# Patient Record
Sex: Male | Born: 1945 | Race: Black or African American | Hispanic: No | Marital: Married | Smoking: Former smoker
Health system: Southern US, Community
[De-identification: ages and names within clinical notes are randomized; demographics above are authoritative.]

## PROBLEM LIST (undated history)

## (undated) DIAGNOSIS — M109 Gout, unspecified: Secondary | ICD-10-CM

## (undated) DIAGNOSIS — I10 Essential (primary) hypertension: Secondary | ICD-10-CM

## (undated) DIAGNOSIS — K635 Polyp of colon: Secondary | ICD-10-CM

## (undated) DIAGNOSIS — G629 Polyneuropathy, unspecified: Secondary | ICD-10-CM

## (undated) DIAGNOSIS — B1920 Unspecified viral hepatitis C without hepatic coma: Secondary | ICD-10-CM

## (undated) DIAGNOSIS — D696 Thrombocytopenia, unspecified: Secondary | ICD-10-CM

## (undated) DIAGNOSIS — Z9289 Personal history of other medical treatment: Secondary | ICD-10-CM

## (undated) DIAGNOSIS — I4892 Unspecified atrial flutter: Secondary | ICD-10-CM

## (undated) DIAGNOSIS — D509 Iron deficiency anemia, unspecified: Secondary | ICD-10-CM

## (undated) HISTORY — PX: CERVICAL DISC SURGERY: SHX588

## (undated) HISTORY — DX: Unspecified viral hepatitis C without hepatic coma: B19.20

## (undated) HISTORY — PX: REVISION TOTAL KNEE ARTHROPLASTY: SHX767

## (undated) HISTORY — PX: BACK SURGERY: SHX140

## (undated) HISTORY — DX: Polyp of colon: K63.5

## (undated) HISTORY — DX: Iron deficiency anemia, unspecified: D50.9

## (undated) HISTORY — DX: Thrombocytopenia, unspecified: D69.6

## (undated) HISTORY — DX: Personal history of other medical treatment: Z92.89

---

## 2010-10-04 ENCOUNTER — Observation Stay (HOSPITAL_COMMUNITY)
Admission: EM | Admit: 2010-10-04 | Discharge: 2010-10-06 | Payer: Self-pay | Source: Home / Self Care | Admitting: Emergency Medicine

## 2010-10-08 ENCOUNTER — Encounter: Payer: Self-pay | Admitting: Cardiology

## 2010-10-12 DIAGNOSIS — M109 Gout, unspecified: Secondary | ICD-10-CM | POA: Insufficient documentation

## 2010-10-12 DIAGNOSIS — I10 Essential (primary) hypertension: Secondary | ICD-10-CM | POA: Insufficient documentation

## 2010-10-15 ENCOUNTER — Ambulatory Visit: Payer: Self-pay | Admitting: Cardiology

## 2010-11-08 ENCOUNTER — Inpatient Hospital Stay (HOSPITAL_COMMUNITY)
Admission: RE | Admit: 2010-11-08 | Discharge: 2010-11-10 | Payer: Self-pay | Source: Home / Self Care | Attending: Orthopedic Surgery | Admitting: Orthopedic Surgery

## 2010-11-10 ENCOUNTER — Ambulatory Visit: Payer: Self-pay | Admitting: Oncology

## 2010-11-25 ENCOUNTER — Ambulatory Visit: Payer: Self-pay | Admitting: Oncology

## 2010-11-25 LAB — CBC & DIFF AND RETIC
BASO%: 0.4 % (ref 0.0–2.0)
Basophils Absolute: 0 10*3/uL (ref 0.0–0.1)
EOS%: 1.4 % (ref 0.0–7.0)
Eosinophils Absolute: 0.1 10*3/uL (ref 0.0–0.5)
HCT: 30.1 % — ABNORMAL LOW (ref 38.4–49.9)
HGB: 9.9 g/dL — ABNORMAL LOW (ref 13.0–17.1)
Immature Retic Fract: 3.3 % (ref 0.00–13.40)
LYMPH%: 29.4 % (ref 14.0–49.0)
MCH: 27 pg — ABNORMAL LOW (ref 27.2–33.4)
MCHC: 32.9 g/dL (ref 32.0–36.0)
MCV: 82.2 fL (ref 79.3–98.0)
MONO#: 0.8 10*3/uL (ref 0.1–0.9)
MONO%: 10.4 % (ref 0.0–14.0)
NEUT#: 4.3 10*3/uL (ref 1.5–6.5)
NEUT%: 58.4 % (ref 39.0–75.0)
Platelets: 272 10*3/uL (ref 140–400)
RBC: 3.66 10*6/uL — ABNORMAL LOW (ref 4.20–5.82)
RDW: 16.8 % — ABNORMAL HIGH (ref 11.0–14.6)
Retic %: 2.2 % — ABNORMAL HIGH (ref 0.50–1.60)
Retic Ct Abs: 80.52 10*3/uL — ABNORMAL HIGH (ref 24.10–77.50)
WBC: 7.3 10*3/uL (ref 4.0–10.3)
lymph#: 2.2 10*3/uL (ref 0.9–3.3)

## 2010-11-25 LAB — MORPHOLOGY: PLT EST: ADEQUATE

## 2010-11-25 LAB — COMPREHENSIVE METABOLIC PANEL
ALT: 15 U/L (ref 0–53)
AST: 29 U/L (ref 0–37)
Albumin: 3.6 g/dL (ref 3.5–5.2)
Alkaline Phosphatase: 50 U/L (ref 39–117)
BUN: 14 mg/dL (ref 6–23)
CO2: 27 mEq/L (ref 19–32)
Calcium: 9.9 mg/dL (ref 8.4–10.5)
Chloride: 105 mEq/L (ref 96–112)
Creatinine, Ser: 1.19 mg/dL (ref 0.40–1.50)
Glucose, Bld: 101 mg/dL — ABNORMAL HIGH (ref 70–99)
Potassium: 4.1 mEq/L (ref 3.5–5.3)
Sodium: 140 mEq/L (ref 135–145)
Total Bilirubin: 0.8 mg/dL (ref 0.3–1.2)
Total Protein: 8 g/dL (ref 6.0–8.3)

## 2010-11-25 LAB — LACTATE DEHYDROGENASE: LDH: 261 U/L — ABNORMAL HIGH (ref 94–250)

## 2010-11-25 LAB — CHCC SMEAR

## 2010-11-26 LAB — IRON AND TIBC
%SAT: 19 % — ABNORMAL LOW (ref 20–55)
Iron: 70 ug/dL (ref 42–165)
TIBC: 370 ug/dL (ref 215–435)
UIBC: 300 ug/dL

## 2010-12-21 NOTE — Assessment & Plan Note (Signed)
Summary: np6/ surgical clearance- right total knee - pt has medicare.gd   Visit Type:  Initial Consult Referring Provider:  Dr. Lajoyce Corners Primary Provider:  [facility]  CC:  Preoperative and hypertension.  History of Present Illness: The patient presents for preoperative evaluation prior to having knee replacement.  He has had no prior cardiac history. He has had hypertension which was previously well controlled. However, he was recently admitted with a gout flare and apparently was dehydrated. He was taken off atenolol and his HCTZ was reduced. He subsequently has had difficult to control blood pressures. His wife restarted the previous dose of atenolol HCTZ 2 days ago. For the last couple of days his blood pressure has been running still in the 180-170 systolic range. He is limited in his activities because of knee pain and is due to have right knee replacement. He walks with a walker or rides in a wheelchair because of this. Prior to the pain becoming so severe he had some limitations walking with a limp since previous knee surgeries on the left. However, he has been active. He does not have any chest pressure, neck or arm discomfort. He does not have any palpitations, presyncope or syncope. He has no shortness of breath, PND or orthopnea. He has no weight gain but does have chronic right greater than left lower extremity edema.  Current Medications (verified): 1)  Trazodone Hcl 100 Mg Tabs (Trazodone Hcl) .... One Tab By Mouth Hs As Needed 2)  Sb Docusate Sodium/senna 8.6-50 Mg Tabs (Sennosides-Docusate Sodium) .... 2 Tab By Mouth Hs As Needed 3)  Citalopram Hydrobromide 40 Mg Tabs (Citalopram Hydrobromide) .Marland Kitchen.. 1 and 1/2 Tab By Mouth Once Daily 4)  Atenolol-Chlorthalidone 100-25 Mg Tabs (Atenolol-Chlorthalidone) .... One Tab By Mouth Once Daily 5)  Indomethacin 50 Mg Caps (Indomethacin) .... One Tab By Mouth Q 6 Hours As Needed 6)  Pantoprazole Sodium 40 Mg Tbec (Pantoprazole Sodium) .... One  Tab By Mouth Once Daily  Allergies (verified): 1)  ! * Lenzolid  Past History:  Past Medical History:  1. Hypertension.   2. Gout.   3. Reflex sympathetic dystrophy  4. PTSD  Past Surgical History: Several knee  surgeries on the left to include total knee arthroplasty that failed and   was revised through the [place] with a total of approximately 6 surgeries on  that knee.  Cervical disc surgery Low back surgery  Family History: Reviewed history from 10/12/2010 and no changes required. His father had emphysema.  His mother had a stroke.  Social History: Reviewed history from 10/12/2010 and no changes required.  The patient has a history of being in [employer].  He   is a retired [employer] architect.  He is married.  He has 3 children.  He has   got a past history of smoking tobacco use, but nothing now and no   history of alcohol or drug abuse.      Review of Systems       Positive for insomnia, back pain. Otherwise as stated in the history of present illness negative for all other systems.  Vital Signs:  Patient profile:   65 year old male Height:      73 inches Weight:      202 pounds BMI:     26.75 Pulse rate:   67 / minute Pulse rhythm:   regular BP sitting:   168 / 70  (right arm) Cuff size:   regular  Vitals Entered By: Deliah Goody,  RN (October 15, 2010 12:55 PM)  Physical Exam  General:  Well developed, well nourished, in no acute distress. Head:  normocephalic and atraumatic Eyes:  PERRLA/EOM intact; conjunctiva and lids normal. Mouth:  Teeth, gums and palate normal. Oral mucosa normal. Neck:  Neck supple, no JVD. No masses, thyromegaly or abnormal cervical nodes. Chest Wall:  no deformities or breast masses noted Lungs:  Clear bilaterally to auscultation and percussion. Abdomen:  Bowel sounds positive; abdomen soft and non-tender without masses, organomegaly, or hernias noted. No hepatosplenomegaly. Msk:  Lower extremity muscle wasting, bilateral chronic  degenerative knee changes Extremities:  Mild right greater than left lower extremity edema Neurologic:  Alert and oriented x 3. Skin:  Intact without lesions or rashes. Cervical Nodes:  no significant adenopathy Inguinal Nodes:  no significant adenopathy Psych:  Normal affect.   Detailed Cardiovascular Exam  Neck    Carotids: Carotids full and equal bilaterally without bruits.      Neck Veins: Normal, no JVD.    Heart    Inspection: no deformities or lifts noted.      Palpation: normal PMI with no thrills palpable.      Auscultation: regular rate and rhythm, S1, S2 without murmurs, rubs, gallops, or clicks.    Vascular    Abdominal Aorta: no palpable masses, pulsations, or audible bruits.      Femoral Pulses: normal femoral pulses bilaterally.      Pedal Pulses: normal pedal pulses bilaterally.      Radial Pulses: normal radial pulses bilaterally.      Peripheral Circulation: no clubbing, cyanosis, or edema noted with normal capillary refill.     EKG  Procedure date:  10/15/2010  Findings:      Sinus rhythm, rate 67, axis within normal limits, intervals within normal limits, no acute ST-T wave changes.  Impression & Recommendations:  Problem # 1:  PRE-OPERATIVE CARDIOVASCULAR EXAMINATION (ICD-V72.81) The patient is at acceptable risk for the planned surgery according to ACC/AHA guidelines. He has no high risk symptoms or findings. It is a moderate risk surgery from a cardiovascular standpoint. No further cardiovascular testing suggested. Orders: EKG w/ Interpretation (93000)  Problem # 2:  HYPERTENSION (ICD-401.9) His blood pressure is slightly elevated today but he just restarted the atenolol HCTZ at the current dose. I discussed this with the patient and his wife. In in one week his blood pressure is still elevated my plan will be to increase his Catapres patch to #2. If further adjustments are needed prior to his December 19 surgery we would need to see him again in the  office. Orders: EKG w/ Interpretation (93000)  Problem # 3:  GOUT (ICD-274.9) He understands that HCTZ could precipitate gouty flare and will let me know if this happens again. He has tolerated this drug for years.  Patient Instructions: 1)  Your physician recommends that you schedule a follow-up appointment as needed 2)  Your physician recommends that you continue on your current medications as directed. Please refer to the Current Medication list given to you today.

## 2010-12-21 NOTE — Letter (Signed)
Summary: [facility]  [facility]   Imported By: Marylou Mccoy 10/29/2010 18:29:57  _____________________________________________________________________  External Attachment:    Type:   Image     Comment:   External Document

## 2011-01-24 NOTE — Discharge Summary (Signed)
NAME:  Clinton Thomas, Clinton Thomas NO.:  [id number]  MEDICAL RECORD NO.:  [id number]          PATIENT TYPE:  INP  LOCATION:  1615                         FACILITY:  [facility]  PHYSICIAN:  Madlyn Frankel. Charlann Boxer, M.D.  DATE OF BIRTH:  1946/08/10  DATE OF ADMISSION:  11/08/2010 DATE OF DISCHARGE:  11/10/2010                              DISCHARGE SUMMARY   ADMITTING DIAGNOSIS:  Right knee osteoarthritis.  DISCHARGE DIAGNOSES: 1. Right knee osteoarthritis. 2. Hypertension. 3. Gout.  BRIEF HISTORY:  Mr. Minks is a pleasant 65 year old male who had presented to the office on second opinion evaluation of right knee arthritis and history of gout.  He had failed conservative measures and was ready to proceed with knee arthroplasty.  Risks and benefits were discussed and reviewed in the office.  Consent was obtained for above.  HOSPITAL COURSE:  The patient was admitted for same-day surgery on November 08, 2010.  He underwent a right total knee replacement.  Please see dictated operative note for full details of the procedure.  Postoperatively, he was transferred to the recovery room for routine stay without complications.  He was then transferred to the orthopedic ward where he remained for his 2-day hospital stay.  On postoperative day #1, he was seen and evaluated by Physical Therapy. His Hemovac was removed and his Foley catheter drain.  His labs on postop day #1 had hematocrit of 26.4.  His knee was otherwise dry.  By postop day #2, he had stable hematocrit and was down to 24.9 but he otherwise remained stable and was seen and evaluated by Physical Therapy with plans to go home.  He is placed on a regular diet.  He had no perioperative complicating features.  He had been seen and evaluated by Hematology due to chronic liver failure issue.  They gave some recommendations regarding platelet monitoring.  His platelets were 77 and remained that way.  He did not have established  hematology followup in this community.  DISCHARGE INSTRUCTIONS:  The patient will be discharged to home with home health Physical Therapy.  They will work on range of motion, strengthening, and gait training.  He is to keep wound dry until followup, take off his surgical dressing in 7 days.  He will be on a regular diet.  He will return to see Dr. Durene Romans at [facility] at [phone] in 2 weeks.  MEDICATIONS:  His discharge medications include: 1. Norco 7.5/325 one to two tablets every 4 to 6 hours as needed for     pain. 2. Lasix 20 mg p.o. daily as needed for swelling. 3. Xarelto 10 mg p.o. daily x10 days.  He will not use any aspirin     postop based on his deficient platelets anyway. 4. Atenolol/chlorthalidone 100/25 mg 1 tablet q.a.m. 5. Celexa 20 mg q.a.m. 6. Clonidine patch weekly. 7. Colace 100 mg p.o. b.i.d. 8. Hydralazine 50 mg p.o. b.i.d. 9. Protonix 40 mg p.o. daily. 10.Trazodone 100 mg daily.  Questions were encouraged and reviewed at the time of discharge.  He will contact our office for orthopedic questions.     Madlyn Frankel Charlann Boxer, M.D.  MDO/MEDQ  D:  01/24/2011  T:  01/24/2011  Job:  [id number]  Electronically Signed by Durene Romans M.D. on 01/24/2011 01:50:53 PM

## 2011-01-31 LAB — CBC
HCT: 24.9 % — ABNORMAL LOW (ref 39.0–52.0)
HCT: 26.4 % — ABNORMAL LOW (ref 39.0–52.0)
Hemoglobin: 8 g/dL — ABNORMAL LOW (ref 13.0–17.0)
MCH: 26.9 pg (ref 26.0–34.0)
MCH: 27 pg (ref 26.0–34.0)
MCHC: 31.8 g/dL (ref 30.0–36.0)
MCV: 83.8 fL (ref 78.0–100.0)
Platelets: 77 10*3/uL — ABNORMAL LOW (ref 150–400)
RBC: 2.97 MIL/uL — ABNORMAL LOW (ref 4.22–5.81)
RDW: 15.7 % — ABNORMAL HIGH (ref 11.5–15.5)
RDW: 15.9 % — ABNORMAL HIGH (ref 11.5–15.5)
WBC: 9.6 10*3/uL (ref 4.0–10.5)

## 2011-01-31 LAB — BASIC METABOLIC PANEL
BUN: 12 mg/dL (ref 6–23)
Chloride: 103 mEq/L (ref 96–112)
Chloride: 104 mEq/L (ref 96–112)
Creatinine, Ser: 1.08 mg/dL (ref 0.4–1.5)
GFR calc Af Amer: >60 mL/min (ref >60)
GFR calc Af Amer: >60 mL/min (ref >60)
Glucose, Bld: 103 mg/dL — ABNORMAL HIGH (ref 70–99)
Sodium: 137 mEq/L (ref 135–145)
Sodium: 137 mEq/L (ref 135–145)

## 2011-01-31 LAB — DIFFERENTIAL
Basophils Absolute: 0 10*3/uL (ref 0.0–0.1)
Basophils Relative: 0 % (ref 0–1)
Monocytes Absolute: 1.2 10*3/uL — ABNORMAL HIGH (ref 0.1–1.0)
Monocytes Relative: 13 % — ABNORMAL HIGH (ref 3–12)
Neutro Abs: 6.3 10*3/uL (ref 1.7–7.7)

## 2011-01-31 LAB — SAVE SMEAR

## 2011-01-31 LAB — DIC (DISSEMINATED INTRAVASCULAR COAGULATION)PANEL
D-Dimer, Quant: 5.18 ug/mL-FEU — ABNORMAL HIGH (ref 0.00–0.48)
Fibrinogen: 284 mg/dL (ref 204–475)
Platelets: 77 10*3/uL — ABNORMAL LOW (ref 150–400)
Prothrombin Time: 17.8 seconds — ABNORMAL HIGH (ref 11.6–15.2)
aPTT: 39 seconds — ABNORMAL HIGH (ref 24–37)

## 2011-01-31 LAB — TYPE AND SCREEN
ABO/RH(D): O POS
Antibody Screen: NEGATIVE

## 2011-01-31 LAB — ABO/RH: ABO/RH(D): O POS

## 2011-01-31 LAB — HAPTOGLOBIN: Haptoglobin: 36 mg/dL (ref 16–200)

## 2011-01-31 LAB — PLATELET COUNT: Platelets: 85 10*3/uL — ABNORMAL LOW (ref 150–400)

## 2011-02-01 LAB — CBC
HCT: 37 % — ABNORMAL LOW (ref 39.0–52.0)
HCT: 40.5 % (ref 39.0–52.0)
Hemoglobin: 12.1 g/dL — ABNORMAL LOW (ref 13.0–17.0)
MCH: 28.5 pg (ref 26.0–34.0)
MCHC: 33.4 g/dL (ref 30.0–36.0)
MCHC: 33.9 g/dL (ref 30.0–36.0)
MCV: 83.7 fL (ref 78.0–100.0)
MCV: 85.3 fL (ref 78.0–100.0)
Platelets: 95 10*3/uL — ABNORMAL LOW (ref 150–400)
Platelets: 274 10*3/uL (ref 150–400)
RDW: 15.7 % — ABNORMAL HIGH (ref 11.5–15.5)
RDW: 13.4 % (ref 11.5–15.5)
WBC: 7.4 10*3/uL (ref 4.0–10.5)
WBC: 8.1 10*3/uL (ref 4.0–10.5)

## 2011-02-01 LAB — COMPREHENSIVE METABOLIC PANEL
ALT: 19 U/L (ref 0–53)
ALT: 27 U/L (ref 0–53)
AST: 31 U/L (ref 0–37)
Albumin: 3.7 g/dL (ref 3.5–5.2)
Albumin: 2.6 g/dL — ABNORMAL LOW (ref 3.5–5.2)
Alkaline Phosphatase: 62 U/L (ref 39–117)
Alkaline Phosphatase: 56 U/L (ref 39–117)
BUN: 21 mg/dL (ref 6–23)
Calcium: 10 mg/dL (ref 8.4–10.5)
Calcium: 9.6 mg/dL (ref 8.4–10.5)
GFR calc Af Amer: >60 mL/min (ref >60)
GFR calc non Af Amer: >60 mL/min (ref >60)
Potassium: 4.6 mEq/L (ref 3.5–5.1)
Sodium: 136 mEq/L (ref 135–145)
Total Protein: 7.8 g/dL (ref 6.0–8.3)
Total Protein: 7.4 g/dL (ref 6.0–8.3)

## 2011-02-01 LAB — DIFFERENTIAL
Basophils Absolute: 0.1 10*3/uL (ref 0.0–0.1)
Eosinophils Absolute: 0.1 10*3/uL (ref 0.0–0.7)
Eosinophils Absolute: 0.1 10*3/uL (ref 0.0–0.7)
Eosinophils Relative: 1 % (ref 0–5)
Lymphocytes Relative: 32 % (ref 12–46)
Lymphocytes Relative: 23 % (ref 12–46)
Lymphs Abs: 1.7 10*3/uL (ref 0.7–4.0)
Monocytes Absolute: 0.7 10*3/uL (ref 0.1–1.0)
Neutro Abs: 2.8 10*3/uL (ref 1.7–7.7)
Neutrophils Relative %: 62 % (ref 43–77)

## 2011-02-01 LAB — URINALYSIS, ROUTINE W REFLEX MICROSCOPIC
Bilirubin Urine: NEGATIVE
Ketones, ur: NEGATIVE mg/dL
Ketones, ur: NEGATIVE mg/dL
Nitrite: NEGATIVE
Nitrite: NEGATIVE
Protein, ur: NEGATIVE mg/dL
Protein, ur: 30 mg/dL — AB
Specific Gravity, Urine: 1.024 (ref 1.005–1.030)
Urobilinogen, UA: 0.2 mg/dL (ref 0.0–1.0)
Urobilinogen, UA: 1 mg/dL (ref 0.0–1.0)

## 2011-02-01 LAB — BASIC METABOLIC PANEL
BUN: 17 mg/dL (ref 6–23)
CO2: 24 mEq/L (ref 19–32)
Chloride: 95 mEq/L — ABNORMAL LOW (ref 96–112)
GFR calc Af Amer: >60 mL/min (ref >60)
Potassium: 3.9 mEq/L (ref 3.5–5.1)
Sodium: 131 mEq/L — ABNORMAL LOW (ref 135–145)

## 2011-02-01 LAB — URIC ACID: Uric Acid, Serum: 7.2 mg/dL (ref 4.0–7.8)

## 2011-02-01 LAB — PROTIME-INR
INR: 1 (ref 0.00–1.49)
Prothrombin Time: 13.4 seconds (ref 11.6–15.2)

## 2011-02-01 LAB — APTT: aPTT: 33 seconds (ref 24–37)

## 2011-02-01 LAB — SURGICAL PCR SCREEN: MRSA, PCR: NEGATIVE

## 2011-02-01 LAB — URINE MICROSCOPIC-ADD ON

## 2011-02-24 ENCOUNTER — Encounter (HOSPITAL_BASED_OUTPATIENT_CLINIC_OR_DEPARTMENT_OTHER): Payer: BC Managed Care – HMO | Admitting: Oncology

## 2011-02-24 ENCOUNTER — Other Ambulatory Visit: Payer: Self-pay | Admitting: Oncology

## 2011-02-24 DIAGNOSIS — D6959 Other secondary thrombocytopenia: Secondary | ICD-10-CM

## 2011-02-24 LAB — CBC & DIFF AND RETIC
Eosinophils Absolute: 0 10*3/uL (ref 0.0–0.5)
HCT: 35.8 % — ABNORMAL LOW (ref 38.4–49.9)
Immature Retic Fract: 2.9 % (ref 0.00–13.40)
LYMPH%: 37.9 % (ref 14.0–49.0)
MCV: 77.3 fL — ABNORMAL LOW (ref 79.3–98.0)
MONO#: 0.7 10*3/uL (ref 0.1–0.9)
MONO%: 12.6 % (ref 0.0–14.0)
NEUT#: 2.8 10*3/uL (ref 1.5–6.5)
NEUT%: 49.1 % (ref 39.0–75.0)
Platelets: 124 10*3/uL — ABNORMAL LOW (ref 140–400)
RBC: 4.63 10*6/uL (ref 4.20–5.82)
WBC: 5.7 10*3/uL (ref 4.0–10.3)
nRBC: 0 % (ref 0–0)

## 2011-02-24 LAB — MORPHOLOGY

## 2011-02-24 LAB — CHCC SMEAR

## 2011-02-28 LAB — IRON AND TIBC
%SAT: 16 % — ABNORMAL LOW (ref 20–55)
TIBC: 465 ug/dL — ABNORMAL HIGH (ref 215–435)

## 2011-02-28 LAB — PROTEIN ELECTROPHORESIS, SERUM
Albumin ELP: 50.2 % — ABNORMAL LOW (ref 55.8–66.1)
Alpha-1-Globulin: 5.1 % — ABNORMAL HIGH (ref 2.9–4.9)
Alpha-2-Globulin: 10 % (ref 7.1–11.8)
Gamma Globulin: 25.1 % — ABNORMAL HIGH (ref 11.1–18.8)
Total Protein, Serum Electrophoresis: 7.7 g/dL (ref 6.0–8.3)

## 2011-02-28 LAB — FOLATE RBC: RBC Folate: 727 ng/mL (ref >=366)

## 2012-07-30 ENCOUNTER — Encounter (HOSPITAL_COMMUNITY): Payer: Self-pay | Admitting: Cardiology

## 2012-07-30 ENCOUNTER — Emergency Department (HOSPITAL_COMMUNITY): Payer: Medicare Other

## 2012-07-30 ENCOUNTER — Emergency Department (HOSPITAL_COMMUNITY)
Admission: EM | Admit: 2012-07-30 | Discharge: 2012-07-30 | Disposition: A | Discharge status: Home / Self Care | Payer: Medicare Other | Attending: Emergency Medicine | Admitting: Emergency Medicine

## 2012-07-30 DIAGNOSIS — R0602 Shortness of breath: Secondary | ICD-10-CM | POA: Insufficient documentation

## 2012-07-30 DIAGNOSIS — I4892 Unspecified atrial flutter: Secondary | ICD-10-CM

## 2012-07-30 DIAGNOSIS — R079 Chest pain, unspecified: Secondary | ICD-10-CM | POA: Insufficient documentation

## 2012-07-30 DIAGNOSIS — Z79899 Other long term (current) drug therapy: Secondary | ICD-10-CM | POA: Insufficient documentation

## 2012-07-30 DIAGNOSIS — Z7982 Long term (current) use of aspirin: Secondary | ICD-10-CM | POA: Insufficient documentation

## 2012-07-30 HISTORY — DX: Gout, unspecified: M10.9

## 2012-07-30 HISTORY — DX: Polyneuropathy, unspecified: G62.9

## 2012-07-30 HISTORY — DX: Essential (primary) hypertension: I10

## 2012-07-30 HISTORY — DX: Unspecified atrial flutter: I48.92

## 2012-07-30 LAB — POCT I-STAT, CHEM 8
Calcium, Ion: 1.23 mmol/L (ref 1.13–1.30)
Chloride: 106 mEq/L (ref 96–112)
Creatinine, Ser: 0.9 mg/dL (ref 0.50–1.35)
Glucose, Bld: 85 mg/dL (ref 70–99)
HCT: 37 % — ABNORMAL LOW (ref 39.0–52.0)
Potassium: 4.1 mEq/L (ref 3.5–5.1)

## 2012-07-30 LAB — CBC WITH DIFFERENTIAL/PLATELET
Basophils Absolute: 0 10*3/uL (ref 0.0–0.1)
Basophils Relative: 1 % (ref 0–1)
HCT: 32.8 % — ABNORMAL LOW (ref 39.0–52.0)
Hemoglobin: 10.4 g/dL — ABNORMAL LOW (ref 13.0–17.0)
Lymphocytes Relative: 30 % (ref 12–46)
MCHC: 31.7 g/dL (ref 30.0–36.0)
Monocytes Relative: 11 % (ref 3–12)
Neutro Abs: 4.4 10*3/uL (ref 1.7–7.7)
Neutrophils Relative %: 57 % (ref 43–77)
RBC: 4.52 MIL/uL (ref 4.22–5.81)
WBC: 7.8 10*3/uL (ref 4.0–10.5)

## 2012-07-30 LAB — URINE MICROSCOPIC-ADD ON

## 2012-07-30 LAB — TROPONIN I: Troponin I: <0.3 ng/mL (ref <0.30)

## 2012-07-30 LAB — COMPREHENSIVE METABOLIC PANEL
AST: 32 U/L (ref 0–37)
Albumin: 3.6 g/dL (ref 3.5–5.2)
Alkaline Phosphatase: 81 U/L (ref 39–117)
BUN: 7 mg/dL (ref 6–23)
CO2: 22 mEq/L (ref 19–32)
Chloride: 102 mEq/L (ref 96–112)
GFR calc non Af Amer: >90 mL/min (ref >90)
Potassium: 4 mEq/L (ref 3.5–5.1)
Total Bilirubin: 0.5 mg/dL (ref 0.3–1.2)

## 2012-07-30 LAB — URINALYSIS, ROUTINE W REFLEX MICROSCOPIC
Glucose, UA: NEGATIVE mg/dL
Hgb urine dipstick: NEGATIVE
Ketones, ur: NEGATIVE mg/dL
Protein, ur: >300 mg/dL — AB

## 2012-07-30 MED ORDER — DILTIAZEM HCL 25 MG/5ML IV SOLN
15.0000 mg | Freq: Once | INTRAVENOUS | Status: AC
  Administered 2012-07-30: 15 mg via INTRAVENOUS

## 2012-07-30 MED ORDER — DILTIAZEM HCL ER BEADS 240 MG PO CP24: 360.0000 mg | ORAL_CAPSULE | Freq: Every day | ORAL | Status: DC

## 2012-07-30 MED ORDER — DEXTROSE 5 % IV SOLN
10.0000 mg/h | Freq: Once | INTRAVENOUS | Status: AC
  Administered 2012-07-30 (×2): 10 mg/h via INTRAVENOUS

## 2012-07-30 MED ORDER — SODIUM CHLORIDE 0.9 % IV SOLN
INTRAVENOUS | Status: DC
  Administered 2012-07-30: 14:00:00 via INTRAVENOUS

## 2012-07-30 MED ORDER — METRONIDAZOLE 500 MG PO TABS
2000.0000 mg | ORAL_TABLET | Freq: Once | ORAL | Status: AC
  Administered 2012-07-30: 2000 mg via ORAL
  Filled 2012-07-30: qty 4

## 2012-07-30 MED ORDER — DILTIAZEM HCL ER COATED BEADS 360 MG PO CP24
360.0000 mg | ORAL_CAPSULE | Freq: Every day | ORAL | Status: DC
  Administered 2012-07-30: 360 mg via ORAL
  Filled 2012-07-30: qty 1

## 2012-07-30 NOTE — ED Notes (Signed)
Pt. Alert and oriented x4. Stable. Respirations even, pulses strong, denies dizziness or HA.

## 2012-07-30 NOTE — ED Notes (Signed)
Pt reporting cp x 1 week. Intermittent, went to PCP today for pain. When arrived noticed pt in atrial flutter

## 2012-07-30 NOTE — ED Provider Notes (Signed)
History     CSN: [id number]  Arrival date & time 07/30/12  1212   First MD Initiated Contact with Patient 07/30/12 1315      Chief Complaint  Patient presents with  . Chest Pain    (Consider location/radiation/quality/duration/timing/severity/associated sxs/prior treatment) HPI Comments: Old man who says that he's had chest pain coming and going for about 4 days. He thought it was acid indigestion. This morning the pain was fairly severe, and so he went to an urgent care Center and was found to have atrial flutter and was sent to [facility] for evaluation. He had been to the [facility] who was hospitalized about a year ago with a diagnosis of atrial flutter.  Patient is a 66 y.o. male presenting with chest pain. The history is provided by the patient. No language interpreter was used.  Chest Pain The chest pain began 3 - 5 days ago. Episode Length: 10 episodes of chest pain over the past 4 days, worse early this morning. Chest pain occurs intermittently. The chest pain is worsening. Associated with: .Nothing. The severity of the pain is moderate. Quality: Like indigestion. The pain does not radiate. Exacerbated by: Change of position. Pertinent negatives for primary symptoms include no fever. He tried nothing for the symptoms. Risk factors: Prior episodes of atrial flutter. Past medical history comments: Atrial flutter.     No past medical history on file.  No past surgical history on file.  No family history on file.  History  Substance Use Topics  . Smoking status: Not on file  . Smokeless tobacco: Not on file  . Alcohol Use: Not on file      Review of Systems  Constitutional: Negative.  Negative for fever and chills.  HENT: Negative.   Eyes: Negative.   Respiratory: Negative.   Cardiovascular: Positive for chest pain.  Gastrointestinal: Negative.   Genitourinary: Negative.   Musculoskeletal: Negative.   Skin: Negative.   Neurological: Negative.     Psychiatric/Behavioral: Negative.     Allergies  Linezolid  Home Medications   Current Outpatient Rx  Name Route Sig Dispense Refill  . ALLOPURINOL 300 MG PO TABS Oral Take 300 mg by mouth daily.    . ASPIRIN EC 325 MG PO TBEC Oral Take 325 mg by mouth daily.    . COLCHICINE 0.6 MG PO TABS Oral Take 0.6 mg by mouth daily.    . CYCLOBENZAPRINE HCL 10 MG PO TABS Oral Take 10 mg by mouth at bedtime.    Marland Kitchen DILTIAZEM HCL ER BEADS 360 MG PO CP24 Oral Take 360 mg by mouth daily.    Marland Kitchen LISINOPRIL-HYDROCHLOROTHIAZIDE 20-12.5 MG PO TABS Oral Take 1 tablet by mouth daily.    Marland Kitchen OMEPRAZOLE 20 MG PO CPDR Oral Take 20 mg by mouth daily.    . SENNA-DOCUSATE SODIUM 8.6-50 MG PO TABS Oral Take 2 tablets by mouth daily.    . SERTRALINE HCL 100 MG PO TABS Oral Take 100 mg by mouth daily.    . TRAZODONE HCL 100 MG PO TABS Oral Take 100-200 mg by mouth 3 times/day as needed-between meals & bedtime. For sleep      BP 168/90  Pulse 111  Temp 97.9 F (36.6 C) (Oral)  Resp 13  SpO2 100%  Physical Exam  Nursing note and vitals reviewed. Constitutional: He is oriented to person, place, and time. He appears well-developed and well-nourished.       Mild distress with chest pain.  Has atrial flutter on  monitor.  HENT:  Head: Normocephalic and atraumatic.  Right Ear: External ear normal.  Left Ear: External ear normal.  Mouth/Throat: Oropharynx is clear and moist.  Eyes: Conjunctivae and EOM are normal. Pupils are equal, round, and reactive to light.  Neck: Normal range of motion. Neck supple.  Cardiovascular: Normal rate, regular rhythm and normal heart sounds.   Pulmonary/Chest: Effort normal and breath sounds normal.  Abdominal: Soft. Bowel sounds are normal.  Musculoskeletal: Normal range of motion. He exhibits no edema and no tenderness.  Neurological: He is alert and oriented to person, place, and time.       No sensory or motor deficit.  Skin: Skin is warm and dry.  Psychiatric: He has a normal  mood and affect. His behavior is normal.    ED Course  Procedures (including critical care time)  Results for orders placed during the hospital encounter of 07/30/12  CBC WITH DIFFERENTIAL      Component Value Range   WBC 7.8  4.0 - 10.5 K/uL   RBC 4.52  4.22 - 5.81 MIL/uL   Hemoglobin 10.4 (*) 13.0 - 17.0 g/dL   HCT 16.1 (*) 09.6 - 04.5 %   MCV 72.6 (*) 78.0 - 100.0 fL   MCH 23.0 (*) 26.0 - 34.0 pg   MCHC 31.7  30.0 - 36.0 g/dL   RDW 40.9 (*) 81.1 - 91.4 %   Platelets 132 (*) 150 - 400 K/uL   Neutrophils Relative 57  43 - 77 %   Neutro Abs 4.4  1.7 - 7.7 K/uL   Lymphocytes Relative 30  12 - 46 %   Lymphs Abs 2.4  0.7 - 4.0 K/uL   Monocytes Relative 11  3 - 12 %   Monocytes Absolute 0.9  0.1 - 1.0 K/uL   Eosinophils Relative 1  0 - 5 %   Eosinophils Absolute 0.1  0.0 - 0.7 K/uL   Basophils Relative 1  0 - 1 %   Basophils Absolute 0.0  0.0 - 0.1 K/uL  COMPREHENSIVE METABOLIC PANEL      Component Value Range   Sodium 137  135 - 145 mEq/L   Potassium 4.0  3.5 - 5.1 mEq/L   Chloride 102  96 - 112 mEq/L   CO2 22  19 - 32 mEq/L   Glucose, Bld 86  70 - 99 mg/dL   BUN 7  6 - 23 mg/dL   Creatinine, Ser 7.82  0.50 - 1.35 mg/dL   Calcium 9.8  8.4 - 95.6 mg/dL   Total Protein 8.3  6.0 - 8.3 g/dL   Albumin 3.6  3.5 - 5.2 g/dL   AST 32  0 - 37 U/L   ALT 15  0 - 53 U/L   Alkaline Phosphatase 81  39 - 117 U/L   Total Bilirubin 0.5  0.3 - 1.2 mg/dL   GFR calc non Af Amer >90  >90 mL/min   GFR calc Af Amer >90  >90 mL/min  URINALYSIS, ROUTINE W REFLEX MICROSCOPIC      Component Value Range   Color, Urine YELLOW  YELLOW   APPearance CLEAR  CLEAR   Specific Gravity, Urine 1.013  1.005 - 1.030   pH 6.0  5.0 - 8.0   Glucose, UA NEGATIVE  NEGATIVE mg/dL   Hgb urine dipstick NEGATIVE  NEGATIVE   Bilirubin Urine NEGATIVE  NEGATIVE   Ketones, ur NEGATIVE  NEGATIVE mg/dL   Protein, ur >213 (*) NEGATIVE mg/dL   Urobilinogen, UA  2.0 (*) 0.0 - 1.0 mg/dL   Nitrite NEGATIVE  NEGATIVE    Leukocytes, UA NEGATIVE  NEGATIVE  PROTIME-INR      Component Value Range   Prothrombin Time 13.7  11.6 - 15.2 seconds   INR 1.03  0.00 - 1.49  APTT      Component Value Range   aPTT 31  24 - 37 seconds  POCT I-STAT, CHEM 8      Component Value Range   Sodium 142  135 - 145 mEq/L   Potassium 4.1  3.5 - 5.1 mEq/L   Chloride 106  96 - 112 mEq/L   BUN 6  6 - 23 mg/dL   Creatinine, Ser 4.09  0.50 - 1.35 mg/dL   Glucose, Bld 85  70 - 99 mg/dL   Calcium, Ion 8.11  9.14 - 1.30 mmol/L   TCO2 23  0 - 100 mmol/L   Hemoglobin 12.6 (*) 13.0 - 17.0 g/dL   HCT 78.2 (*) 95.6 - 21.3 %  POCT I-STAT TROPONIN I      Component Value Range   Troponin i, poc 0.01  0.00 - 0.08 ng/mL   Comment 3           URINE MICROSCOPIC-ADD ON      Component Value Range   Squamous Epithelial / LPF RARE  RARE   WBC, UA 7-10  <3 WBC/hpf   RBC / HPF 0-2  <3 RBC/hpf   Urine-Other TRICHOMONAS PRESENT     Dg Chest Port 1 View  07/30/2012  *RADIOLOGY REPORT*  Clinical Data: Chest pain with exertion.  Cough.  Shortness of breath.  Former long-time smoker.  PORTABLE CHEST - 1 VIEW 07/30/2012 1402 hours:  Comparison: Two-view chest x-ray 11/02/2010.  Findings: Suboptimal inspiration which accounts for atelectasis at the bases and accentuates the cardiac silhouette.  Taking this into account, cardiac silhouette likely upper normal.  Lungs otherwise clear.  IMPRESSION: Suboptimal inspiration accounts for bibasilar atelectasis.  No acute cardiopulmonary disease otherwise.   Original Report Authenticated By: Arnell Sieving, M.D.        Date: 07/30/2012  Rate: 80 Rhythm: atrial flutter  QRS Axis: normal  Intervals: WNL  ST/T Wave abnormalities: ST elevations inferiorly  Conduction Disutrbances:none  Narrative Interpretation: Abnormal EKG  Old EKG Reviewed: none available  No response to IV Cardizem bolus and continuous infusion.  Will call [facility] to see and admit him.      1. Atrial flutter         Carleene Cooper III, MD 07/30/12 [id number]

## 2012-07-30 NOTE — ED Notes (Signed)
Heart rate continues to jump to 130s-150s. Titrated drip up to 15 mg/hr

## 2012-07-30 NOTE — Consult Note (Addendum)
HPI: 66 year old male with past medical history of hypertension and atrial flutter for evaluation of chest pain in atrial flutter. Patient states he was diagnosed with atrial flutter approximately one year ago at the [facility]. He was treated with Cardizem and aspirin. I do not have the results of his remaining workup. He apparently has not seen cardiology since that time. Over the past week he has had intermittent chest pain. It can be in the epigastric or right chest area. It increases with certain movements. It lasts seconds and resolve spontaneously. No associated symptoms. He does not have exertional chest pain, dyspnea on exertion, orthopnea, PND, palpitations or syncope. He did not take his morning medications today. He presented and was noted to be in atrial flutter with an elevated rate. Because of his chest pain and atrial flutter cardiology was asked to evaluate. He denies melena, hematochezia, dysphasia or other blood loss.   (Not in a hospital admission)  Allergies  Allergen Reactions  . Linezolid Other (See Comments)    unknown    Past Medical History  Diagnosis Date  . Hypertension   . Atrial flutter   . Peripheral neuropathy   . Gout     Past Surgical History  Procedure Date  . Revision total knee arthroplasty     Both  . Back surgery   . Cervical disc surgery     History   Social History  . Marital Status: Married    Spouse Name: N/A    Number of Children: 2  . Years of Education: N/A   Occupational History  .      Retired   Social History Main Topics  . Smoking status: Former Games developer  . Smokeless tobacco: Not on file  . Alcohol Use: Yes     Occasional  . Drug Use: Not on file  . Sexually Active: Not on file   Other Topics Concern  . Not on file   Social History Narrative  . No narrative on file    Family History  Problem Relation Age of Onset  . Stroke Mother     ROS:  Peripheral neuropathy but no fevers or chills, productive cough,  hemoptysis, dysphasia, odynophagia, melena, hematochezia, dysuria, hematuria, rash, seizure activity, orthopnea, PND, pedal edema, claudication. Remaining systems are negative.  Physical Exam:   Blood pressure 158/91, pulse 108, temperature 97.8 F (36.6 C), temperature source Oral, resp. rate 15, SpO2 100.00%.  General:  Well developed/well nourished in NAD Skin warm/dry Patient not depressed No peripheral clubbing Back-normal HEENT-normal/normal eyelids Neck supple/normal carotid upstroke bilaterally; no bruits; no JVD; no thyromegaly chest - CTA/ normal expansion CV - irregular and tachycardic/normal S1 and S2; no rubs or gallops;  PMI nondisplaced, 2/6 systolic murmur apex. Abdomen -NT/ND, no HSM, no mass, + bowel sounds, no bruit 2+ femoral pulses, no bruits Ext-no edema, chords, 2+ DP, lower extremities tender to palpation. Neuro-grossly nonfocal  ECG atrial flutter.  Results for orders placed during the hospital encounter of 07/30/12 (from the past 48 hour(s))  CBC WITH DIFFERENTIAL     Status: Abnormal   Collection Time   07/30/12  1:57 PM      Component Value Range Comment   WBC 7.8  4.0 - 10.5 K/uL    RBC 4.52  4.22 - 5.81 MIL/uL    Hemoglobin 10.4 (*) 13.0 - 17.0 g/dL    HCT 16.1 (*) 09.6 - 52.0 %    MCV 72.6 (*) 78.0 - 100.0 fL    MCH  23.0 (*) 26.0 - 34.0 pg    MCHC 31.7  30.0 - 36.0 g/dL    RDW 19.1 (*) 47.8 - 15.5 %    Platelets 132 (*) 150 - 400 K/uL LARGE PLATELETS PRESENT   Neutrophils Relative 57  43 - 77 %    Neutro Abs 4.4  1.7 - 7.7 K/uL    Lymphocytes Relative 30  12 - 46 %    Lymphs Abs 2.4  0.7 - 4.0 K/uL    Monocytes Relative 11  3 - 12 %    Monocytes Absolute 0.9  0.1 - 1.0 K/uL    Eosinophils Relative 1  0 - 5 %    Eosinophils Absolute 0.1  0.0 - 0.7 K/uL    Basophils Relative 1  0 - 1 %    Basophils Absolute 0.0  0.0 - 0.1 K/uL   COMPREHENSIVE METABOLIC PANEL     Status: Normal   Collection Time   07/30/12  1:57 PM      Component Value Range  Comment   Sodium 137  135 - 145 mEq/L    Potassium 4.0  3.5 - 5.1 mEq/L    Chloride 102  96 - 112 mEq/L    CO2 22  19 - 32 mEq/L    Glucose, Bld 86  70 - 99 mg/dL    BUN 7  6 - 23 mg/dL    Creatinine, Ser 2.95  0.50 - 1.35 mg/dL    Calcium 9.8  8.4 - 62.1 mg/dL    Total Protein 8.3  6.0 - 8.3 g/dL    Albumin 3.6  3.5 - 5.2 g/dL    AST 32  0 - 37 U/L    ALT 15  0 - 53 U/L    Alkaline Phosphatase 81  39 - 117 U/L    Total Bilirubin 0.5  0.3 - 1.2 mg/dL    GFR calc non Af Amer >90  >90 mL/min    GFR calc Af Amer >90  >90 mL/min   PROTIME-INR     Status: Normal   Collection Time   07/30/12  1:57 PM      Component Value Range Comment   Prothrombin Time 13.7  11.6 - 15.2 seconds    INR 1.03  0.00 - 1.49   APTT     Status: Normal   Collection Time   07/30/12  1:57 PM      Component Value Range Comment   aPTT 31  24 - 37 seconds   URINALYSIS, ROUTINE W REFLEX MICROSCOPIC     Status: Abnormal   Collection Time   07/30/12  2:14 PM      Component Value Range Comment   Color, Urine YELLOW  YELLOW    APPearance CLEAR  CLEAR    Specific Gravity, Urine 1.013  1.005 - 1.030    pH 6.0  5.0 - 8.0    Glucose, UA NEGATIVE  NEGATIVE mg/dL    Hgb urine dipstick NEGATIVE  NEGATIVE    Bilirubin Urine NEGATIVE  NEGATIVE    Ketones, ur NEGATIVE  NEGATIVE mg/dL    Protein, ur >308 (*) NEGATIVE mg/dL    Urobilinogen, UA 2.0 (*) 0.0 - 1.0 mg/dL    Nitrite NEGATIVE  NEGATIVE    Leukocytes, UA NEGATIVE  NEGATIVE   URINE MICROSCOPIC-ADD ON     Status: Normal   Collection Time   07/30/12  2:14 PM      Component Value Range Comment   Squamous Epithelial /  LPF RARE  RARE    WBC, UA 7-10  <3 WBC/hpf    RBC / HPF 0-2  <3 RBC/hpf    Urine-Other TRICHOMONAS PRESENT   REPEATED TO VERIFY.  POCT I-STAT TROPONIN I     Status: Normal   Collection Time   07/30/12  2:19 PM      Component Value Range Comment   Troponin i, poc 0.01  0.00 - 0.08 ng/mL    Comment 3            POCT I-STAT, CHEM 8     Status: Abnormal     Collection Time   07/30/12  2:20 PM      Component Value Range Comment   Sodium 142  135 - 145 mEq/L    Potassium 4.1  3.5 - 5.1 mEq/L    Chloride 106  96 - 112 mEq/L    BUN 6  6 - 23 mg/dL    Creatinine, Ser 2.13  0.50 - 1.35 mg/dL    Glucose, Bld 85  70 - 99 mg/dL    Calcium, Ion 0.86  5.78 - 1.30 mmol/L    TCO2 23  0 - 100 mmol/L    Hemoglobin 12.6 (*) 13.0 - 17.0 g/dL    HCT 46.9 (*) 62.9 - 52.0 %     Dg Chest Port 1 View  07/30/2012  *RADIOLOGY REPORT*  Clinical Data: Chest pain with exertion.  Cough.  Shortness of breath.  Former long-time smoker.  PORTABLE CHEST - 1 VIEW 07/30/2012 1402 hours:  Comparison: Two-view chest x-ray 11/02/2010.  Findings: Suboptimal inspiration which accounts for atelectasis at the bases and accentuates the cardiac silhouette.  Taking this into account, cardiac silhouette likely upper normal.  Lungs otherwise clear.  IMPRESSION: Suboptimal inspiration accounts for bibasilar atelectasis.  No acute cardiopulmonary disease otherwise.   Original Report Authenticated By: Arnell Sieving, M.D.     Assessment/Plan #1-chest pain-patient symptoms are not consistent with cardiac etiology. They may be musculoskeletal. Initial enzymes negative. If FU enzymes neg, no further ischemia eval. #2-atrial flutter-the patient's rate is mildly elevated but he has not taken his medications today. Would resume Cardizem at home dose and follow. He has embolic risk factors of hypertension. He needs evaluation of his microcytic anemia including probable colonoscopy and EGD. Once his anemia has been evaluated he will most likely need initiation of anti-coagulation with EP evaluation as an outpatient for consideration of ablation. I would pursue ablation to prevent need for anticoagulation in the future. Check echocardiogram and TSH. Avoid anticoagulation until GI eval complete. #3-microcytic anemia-patient needs further GI evaluation, stool Hemoccults, etc. Add  protonix. #4-hypertension-continue present medications. Olga Millers MD 07/30/2012, 5:28 PM

## 2012-07-30 NOTE — ED Provider Notes (Signed)
Dr. Jens Som of cardiology has seen and evaluated the patient. He is not suspect his chest pain is cardiac. Patient's atrial flutter is rate controlled. We will wean off the drip and give him his home medication. Dr. Jens Som recommends medical evaluation for asymptomatic anemia.  Patient's hemoglobin has been stable for several years.  Dr. Jerolyn Center states anemia workup can be done as an outpatient.  Family and patient in agreement.  Dr. Jens Som in agreement that with rate control, patient can be discharged for cardiology and Gi followup.  BP 169/83  Pulse 87  Temp 97.9 F (36.6 C) (Oral)  Resp 18  SpO2 100% Delta troponin negative.  Chest pain and palpitation free.  Clinton Octave, MD 07/30/12 2035

## 2012-07-30 NOTE — ED Notes (Signed)
Pt in via [facility] from [facility] c/o mid CP non radiating with A flutter appearing on his EKG, pt denies SOB, N/V/D, pt received 324 ASA in route, pt hx of A flutter, pt reports not taking Cardiazem today, pt A&O x4, follows commands, speaks in complete sentences

## 2012-07-31 ENCOUNTER — Encounter: Payer: Self-pay | Admitting: Internal Medicine

## 2012-08-03 ENCOUNTER — Ambulatory Visit (INDEPENDENT_AMBULATORY_CARE_PROVIDER_SITE_OTHER): Payer: Medicare Other | Admitting: Physician Assistant

## 2012-08-03 ENCOUNTER — Telehealth: Payer: Self-pay | Admitting: Internal Medicine

## 2012-08-03 ENCOUNTER — Encounter: Payer: Self-pay | Admitting: Physician Assistant

## 2012-08-03 VITALS — BP 152/82 | HR 93 | Ht 73.0 in | Wt 204.4 lb

## 2012-08-03 DIAGNOSIS — I10 Essential (primary) hypertension: Secondary | ICD-10-CM

## 2012-08-03 DIAGNOSIS — I4892 Unspecified atrial flutter: Secondary | ICD-10-CM

## 2012-08-03 DIAGNOSIS — D509 Iron deficiency anemia, unspecified: Secondary | ICD-10-CM

## 2012-08-03 DIAGNOSIS — R079 Chest pain, unspecified: Secondary | ICD-10-CM

## 2012-08-03 LAB — CBC WITH DIFFERENTIAL/PLATELET
Basophils Absolute: 0.1 10*3/uL (ref 0.0–0.1)
Eosinophils Relative: 2.7 % (ref 0.0–5.0)
HCT: 33.5 % — ABNORMAL LOW (ref 39.0–52.0)
Lymphocytes Relative: 30.9 % (ref 12.0–46.0)
Lymphs Abs: 2.2 10*3/uL (ref 0.7–4.0)
Monocytes Relative: 11.5 % (ref 3.0–12.0)
Neutrophils Relative %: 53.7 % (ref 43.0–77.0)
Platelets: 200 10*3/uL (ref 150.0–400.0)
RDW: 18.9 % — ABNORMAL HIGH (ref 11.5–14.6)
WBC: 7.3 10*3/uL (ref 4.5–10.5)

## 2012-08-03 LAB — TSH: TSH: 0.7 u[IU]/mL (ref 0.35–5.50)

## 2012-08-03 MED ORDER — DILTIAZEM HCL ER BEADS 420 MG PO CP24: 420.0000 mg | ORAL_CAPSULE | Freq: Every day | ORAL | Status: DC

## 2012-08-03 NOTE — Progress Notes (Signed)
[address] [place], [place]  [address] Phone: [phone] Fax:  [phone]  Date:  08/03/2012   Name:  Clinton Thomas   DOB:  [birth year]   MRN:  [id number]  PCP:  Dr. Lavone Neri at [facility] Primary Cardiologist:  Dr. Olga Millers  Primary Electrophysiologist:  None    History of Present Illness: Clinton Thomas is a 66 y.o. male who returns for followup after a visit to the emergency room.  He has a history of HTN, atrial flutter and chest pain. He was seen in the emergency room 07/30/12 by Dr. Jens Som. His heart rate was rapid in the emergency room. He had not had his morning medications yet. Laboratory data demonstrated microcytic anemia. Renal function noted to be normal. Chest x-ray without acute disease. Chest pain was felt to be atypical. Cardiac markers remained negative. No further ischemic evaluation was recommended. Cardizem was resumed for rate control. CHADS2 score 1 with hypertension. It was felt that he would most likely need anticoagulation therapy at some point in the future as well as EP evaluation. However, with his anemia, it was felt that this needed to be evaluated first. GI evaluation was recommended.  He is doing well.  CP is resolving.  It seems to be MSK.  Pain is constant and worse with positional changes.  No dyspnea.  No orthopnea, PND.  No edema.  No syncope.  No palpitations.    Labs (9/13):  K 4.1, creatinine 0.90, ALT 15, Hgb 10.4, MCV 72.6, PLT 132.   Wt Readings from Last 3 Encounters:  08/03/12 204 lb 6.4 oz (92.715 kg)  10/15/10 202 lb (91.627 kg)     Past Medical History  Diagnosis Date  . Hypertension   . Atrial flutter   . Peripheral neuropathy   . Gout     Current Outpatient Prescriptions  Medication Sig Dispense Refill  . allopurinol (ZYLOPRIM) 300 MG tablet Take 300 mg by mouth daily.      Marland Kitchen aspirin EC 325 MG tablet Take 325 mg by mouth daily.      . colchicine 0.6 MG tablet Take 0.6 mg by mouth daily.       . cyclobenzaprine (FLEXERIL) 10 MG tablet Take 10 mg by mouth at bedtime.      Marland Kitchen diltiazem (TIAZAC) 360 MG 24 hr capsule Take 360 mg by mouth daily.      Marland Kitchen lisinopril-hydrochlorothiazide (PRINZIDE,ZESTORETIC) 20-12.5 MG per tablet Take 1 tablet by mouth daily.      Marland Kitchen omeprazole (PRILOSEC) 20 MG capsule Take 20 mg by mouth daily.      . sennosides-docusate sodium (SENOKOT-S) 8.6-50 MG tablet Take 2 tablets by mouth daily.      . sertraline (ZOLOFT) 100 MG tablet Take 100 mg by mouth daily.      . traZODone (DESYREL) 100 MG tablet Take 100-200 mg by mouth 3 times/day as needed-between meals & bedtime. For sleep        Allergies: Allergies  Allergen Reactions  . Linezolid Other (See Comments)    unknown    History  Substance Use Topics  . Smoking status: Former Games developer  . Smokeless tobacco: Not on file  . Alcohol Use: Yes     Occasional     ROS:  Please see the history of present illness.    All other systems reviewed and negative.   PHYSICAL EXAM: VS:  BP 152/82  Pulse 93  Ht 6\' 1"  (1.854 m)  Wt 204 lb  6.4 oz (92.715 kg)  BMI 26.97 kg/m2 Well nourished, well developed, in no acute distress HEENT: normal Neck: no JVD Cardiac:  normal S1, S2; irreg irreg; no murmur Lungs:  clear to auscultation bilaterally, no wheezing, rhonchi or rales Abd: soft, nontender, no hepatomegaly Ext: no edema Skin: warm and dry Neuro:  CNs 2-12 intact, no focal abnormalities noted  EKG:  AFlutter, HR 93, variable AV block      ASSESSMENT AND PLAN:  1. Atrial Flutter:  Rate controlled.  Diltiazem will be increased for HTN as noted below.  Only on ASA now.  GI eval pending due to anemia.  Check TSH.  Check Echo.  Follow up with Dr. Olga Millers in 3-4 weeks.  Eventually plan on anti-coag with possible EP eval to restore NSR.  2. Hypertension:  Increase Tiazac to 420 mg QD.  3. Microcytic Anemia:  GI eval pending.  He had colonoscopy at the [place] last year.  Will get records.  Also saw  Hematology in 2011 for low PLTs.  Check repeat CBC today.  Will see if GI can see him earlier than 10/9.  4. Chest Pain:  Atypical.  No further workup at this time.  Signed, Tereso Newcomer, PA-C  2:52 PM 08/03/2012

## 2012-08-03 NOTE — Patient Instructions (Addendum)
Your physician has recommended you make the following change in your medication: INCREASE DILTIAZEM TO 420 MG DAILY, [facility] AND [facility]YS HAS BEEN CALLED IN TO [facility] TODAY FOR YOU   Your physician has requested that you have an echocardiogram DX A-FLUTTER AND CHEST PAIN. Echocardiography is a painless test that uses sound waves to create images of your heart. It provides your doctor with information about the size and shape of your heart and how well your heart's chambers and valves are working. This procedure takes approximately one hour. There are no restrictions for this procedure.   Your physician recommends that you schedule a follow-up appointment in: 3-4 WEEKS WITH DR. CRENSHAW  DR. Lamar Sprinkles OFFICE WILL CALL YOU TO SEE IF THEY CAN MOVE YOUR APPOINTMENT UP

## 2012-08-06 NOTE — Telephone Encounter (Signed)
Requesting pt be seen sooner than 1st available. Pt scheduled to see Amy Esterwood PA 08/08/12@2pm . Pt to be seen for anemia. [place] [facility]ology to notify pt of appt date and time. All records in Epic per cardiology.

## 2012-08-08 ENCOUNTER — Encounter: Payer: Self-pay | Admitting: Physician Assistant

## 2012-08-08 ENCOUNTER — Ambulatory Visit (INDEPENDENT_AMBULATORY_CARE_PROVIDER_SITE_OTHER): Payer: Medicare Other | Admitting: Physician Assistant

## 2012-08-08 VITALS — BP 152/90 | HR 76 | Ht 71.0 in | Wt 205.0 lb

## 2012-08-08 DIAGNOSIS — G609 Hereditary and idiopathic neuropathy, unspecified: Secondary | ICD-10-CM

## 2012-08-08 DIAGNOSIS — Z862 Personal history of diseases of the blood and blood-forming organs and certain disorders involving the immune mechanism: Secondary | ICD-10-CM | POA: Insufficient documentation

## 2012-08-08 DIAGNOSIS — G629 Polyneuropathy, unspecified: Secondary | ICD-10-CM

## 2012-08-08 DIAGNOSIS — Z86010 Personal history of colonic polyps: Secondary | ICD-10-CM | POA: Insufficient documentation

## 2012-08-08 DIAGNOSIS — M5481 Occipital neuralgia: Secondary | ICD-10-CM | POA: Insufficient documentation

## 2012-08-08 DIAGNOSIS — K921 Melena: Secondary | ICD-10-CM

## 2012-08-08 DIAGNOSIS — D649 Anemia, unspecified: Secondary | ICD-10-CM

## 2012-08-08 DIAGNOSIS — Z8619 Personal history of other infectious and parasitic diseases: Secondary | ICD-10-CM

## 2012-08-08 MED ORDER — NA SULFATE-K SULFATE-MG SULF 17.5-3.13-1.6 GM/177ML PO SOLN: 1.0000 | Freq: Once | ORAL | Status: DC

## 2012-08-08 NOTE — Progress Notes (Signed)
Subjective:    Patient ID: Clinton Thomas, male    DOB: August 05, 1946, 66 y.o.   MRN: [id number]  HPI Clinton Thomas is a 66 year old AA male referred today by Clinton Thomas/cardiology for further evaluation of anemia. Patient was seen by cardiology within the past few days after he had an emergency room visit with and was found to be in atrial flutter. Labs done as part of that evaluation showed a microcytic anemia with hemoglobin of 10.4 hematocrit 32.8 MCV of 77.6 and platelets of 132. He was resumed on Cardizem and at this time is being considered for anticoagulation therapy and therefore GI evaluation is requested . The patient is not the strongest historian, he has received most of his care through the [facility] is a [place] Vet. Reviewing his records he has also been seen by Clinton Thomas the last time about a year and a half ago for anemia and thrombocytopenia. 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 Patient was apparently supposed to undergo a liver biopsy at the [facility] and says that he has never had that. He apparently had been told that he had in the 1990s as far as he is aware has never been treated. He does have a prior history of regular EtOH use but not in the last 10-15 years. Again reviewing his labs CBC from January 2012 showed hemoglobin of 9.9 hematocrit of 30.1 and platelet count of 272 labs from December of 2011 hemoglobin 8 hematocrit of 24.9 MCV of 83.8 and platelets of 77 ferritin level in 2012 was normal at 144 his iron levels have been normal his iron saturation is been mildly decreased in the 19-20 range. The patient states that he did have a colonoscopy sometime within the past couple of years at the [facility] and he believes he had one polyp removed and was told to come back in 5 years. He has never had any GI bleeding that he's been  aware of and no transfusions. He denies any problems with his bowels- takes occasional mag citrate if he gets constipated has not noted any recent changes in his bowel habits and has been unaware of any melena or hematochezia. He denies any abdominal pain He says occasionally gets some indigestion but not on regular basis.Denies any dysphagia or odynophagia, appetite has been fine, weight has been stable.Takes one [place] daily, no Nsaids.    Review of Systems  Constitutional: Negative.   HENT: Negative.   Eyes: Negative.   Respiratory: Negative.   Cardiovascular: Positive for chest pain.  Genitourinary: Negative.   Musculoskeletal: Positive for gait problem.  Neurological: Negative.   Hematological: Negative.   Psychiatric/Behavioral: Negative.    Outpatient Encounter Prescriptions as of 08/08/2012  Medication Sig Dispense Refill  . allopurinol (ZYLOPRIM) 300 MG tablet Take 300 mg by mouth daily.      Marland Kitchen aspirin EC 325 MG tablet Take 325 mg by mouth daily.      . colchicine 0.6 MG tablet Take 0.6 mg by mouth daily.      . cyclobenzaprine (FLEXERIL) 10 MG tablet Take 10 mg by mouth at bedtime.      Marland Kitchen diltiazem (TIAZAC) 420 MG 24 hr capsule Take 1 capsule (420 mg total) by mouth daily.  30 capsule  1  . lisinopril-hydrochlorothiazide (PRINZIDE,ZESTORETIC) 20-12.5 MG per tablet Take 1 tablet by mouth daily.      Marland Kitchen  Na Sulfate-K Sulfate-Mg Sulf SOLN Take 1 kit by mouth once.  354 mL  0  . omeprazole (PRILOSEC) 20 MG capsule Take 20 mg by mouth daily.      . sennosides-docusate sodium (SENOKOT-S) 8.6-50 MG tablet Take 2 tablets by mouth daily.      . sertraline (ZOLOFT) 100 MG tablet Take 100 mg by mouth daily.      . traZODone (DESYREL) 100 MG tablet Take 100-200 mg by mouth 3 times/day as needed-between meals & bedtime. For sleep       Allergies  Allergen Reactions  . Linezolid Other (See Comments)    unknown   Patient Active Problem List  Diagnosis  . GOUT  . HYPERTENSION  . Atrial  flutter  . Hx of colonic polyps  . Hx of hepatitis C  . Peripheral neuropathy  . Anemia  . Hx of thrombocytopenia    History   Social History  . Marital Status: Married    Spouse Name: N/A    Number of Children: 2  . Years of Education: N/A   Occupational History  . Retired     Retired   Social History Main Topics  . Smoking status: Former Games developer  . Smokeless tobacco: Never Used  . Alcohol Use: Yes     Occasional  . Drug Use: Not on file  . Sexually Active: Not on file   Other Topics Concern  . Not on file   Social History Narrative  . No narrative on file       Objective:   Physical Exam well-developed AA male in no acute distress, ambulates with a cane. Blood pressure 152/90 pulse 76 height 5 foot 11 weight 205. HEENT; nontraumatic normocephalic EOMI PERRLA sclera anicteric,Neck; Supple no JVD, Cardiovascular; regular rate and rhythm with S1-S2 no murmur or gallop, Pulmonary; clear bilaterally, Abdomen; soft nontender nondistended bowel sounds are active there is no palpable mass or hepatosplenomegaly, Rectal; exam no external lesion noted- stool is brown and Hemoccult positive, Extremities; no clubbing cyanosis or edema skin warm and dry, Psych; mood and affect appropriate.        Assessment & Plan:  #36 66 year old male with a chronic anemia, microcytic at this point the previously not iron deficient. He has also had a mild thrombocytopenia over the past couple of years. He is currently asymptomatic but has Hemoccult-positive stool. Will need further GI evaluation to rule out occult lesion, AVMs etc. #2 history of atrial flutter with recent ER visit with chest pain and atrial flutter-are going cardiac evaluation and may need chronic anticoagulation  #3 history of colon polyps by patient report #4 history of hepatitis C by patient report, never treated-rule out underlying liver disease contributing to  anemia and thrombocytopenia. #5 peripheral neuropathy #6  history of gout #7 hypertension  Plan; Will try to obtain his records from the during [place] about patient has a copy of his last colonoscopy and will bring that to [place]. Will schedule for colonoscopy and upper endoscopy with Dr. Arlyce Dice, procedure discussed in detail with the patient and he is agreeable to proceed Schedule upper abdominal ultrasound-rule out cirrhosis Check hepatitis C Quant  PCR

## 2012-08-08 NOTE — Patient Instructions (Addendum)
Please go to the basement level to have your labs drawn.  We have scheduled the Endoscopy/Colonoscopy with Dr. Arlyce Dice on 09-04-2012. Directions and brochure provided. We have given you a samples of the SUPREP , the colonoscopy prep.  We scheduled the Ultrasound for Friday 08-10-2012 . Arrive at 7:45 Am to [facility], 1st floor. Have nothing to eat or drink after ,midnight, night before the test.

## 2012-08-08 NOTE — Progress Notes (Signed)
Reviewed and agree with management. Kale Dols D. Gumecindo Hopkin, M.D., FACG  

## 2012-08-09 ENCOUNTER — Telehealth: Payer: Self-pay | Admitting: *Deleted

## 2012-08-09 ENCOUNTER — Other Ambulatory Visit: Payer: Medicare Other

## 2012-08-09 ENCOUNTER — Ambulatory Visit (HOSPITAL_COMMUNITY): Payer: Medicare Other | Attending: Cardiology

## 2012-08-09 ENCOUNTER — Telehealth: Payer: Self-pay | Admitting: Physician Assistant

## 2012-08-09 ENCOUNTER — Encounter: Payer: Self-pay | Admitting: Physician Assistant

## 2012-08-09 DIAGNOSIS — I4892 Unspecified atrial flutter: Secondary | ICD-10-CM | POA: Insufficient documentation

## 2012-08-09 DIAGNOSIS — I369 Nonrheumatic tricuspid valve disorder, unspecified: Secondary | ICD-10-CM | POA: Insufficient documentation

## 2012-08-09 DIAGNOSIS — R079 Chest pain, unspecified: Secondary | ICD-10-CM

## 2012-08-09 DIAGNOSIS — K921 Melena: Secondary | ICD-10-CM

## 2012-08-09 DIAGNOSIS — I10 Essential (primary) hypertension: Secondary | ICD-10-CM | POA: Insufficient documentation

## 2012-08-09 DIAGNOSIS — D649 Anemia, unspecified: Secondary | ICD-10-CM

## 2012-08-09 DIAGNOSIS — I059 Rheumatic mitral valve disease, unspecified: Secondary | ICD-10-CM | POA: Insufficient documentation

## 2012-08-09 NOTE — Telephone Encounter (Signed)
pt notified about echo results today w/verbal understanding today

## 2012-08-09 NOTE — Telephone Encounter (Signed)
Forward 2 pages from patient (Dept of [facility]) to Dr. Mike Gip for review on 08-09-12 ym

## 2012-08-09 NOTE — Telephone Encounter (Signed)
Message copied by Tarri Fuller on Thu Aug 09, 2012  3:21 PM ------      Message from: [place], [place] T      Created: Thu Aug 09, 2012  1:49 PM       Normal LV fxn      Mod to severe LVH - no gradient (important to keep BP controlled)      No valve abnormalities      Tereso Newcomer, PA-C  1:48 PM 08/09/2012

## 2012-08-09 NOTE — Progress Notes (Signed)
Echocardiogram performed.  

## 2012-08-10 ENCOUNTER — Ambulatory Visit (HOSPITAL_COMMUNITY)
Admission: RE | Admit: 2012-08-10 | Discharge: 2012-08-10 | Disposition: A | Discharge status: Home / Self Care | Payer: Medicare Other | Source: Ambulatory Visit | Attending: Physician Assistant | Admitting: Physician Assistant

## 2012-08-10 DIAGNOSIS — K921 Melena: Secondary | ICD-10-CM

## 2012-08-10 DIAGNOSIS — B1920 Unspecified viral hepatitis C without hepatic coma: Secondary | ICD-10-CM | POA: Insufficient documentation

## 2012-08-10 LAB — HEPATITIS C RNA QUANTITATIVE
HCV Quantitative Log: 6.9 {Log} — ABNORMAL HIGH (ref <1.63)
HCV Quantitative: 7993285 IU/mL — ABNORMAL HIGH (ref <43)

## 2012-08-29 ENCOUNTER — Ambulatory Visit: Payer: Medicare Other | Admitting: Internal Medicine

## 2012-08-31 ENCOUNTER — Encounter: Payer: Self-pay | Admitting: Cardiovascular Disease

## 2012-08-31 ENCOUNTER — Encounter: Payer: Self-pay | Admitting: *Deleted

## 2012-09-03 ENCOUNTER — Ambulatory Visit (INDEPENDENT_AMBULATORY_CARE_PROVIDER_SITE_OTHER): Payer: Medicare Other | Admitting: Cardiology

## 2012-09-03 ENCOUNTER — Encounter: Payer: Self-pay | Admitting: Cardiology

## 2012-09-03 VITALS — BP 167/95 | HR 87 | Wt 202.0 lb

## 2012-09-03 DIAGNOSIS — I4892 Unspecified atrial flutter: Secondary | ICD-10-CM

## 2012-09-03 DIAGNOSIS — I10 Essential (primary) hypertension: Secondary | ICD-10-CM

## 2012-09-03 DIAGNOSIS — Z8619 Personal history of other infectious and parasitic diseases: Secondary | ICD-10-CM

## 2012-09-03 NOTE — Assessment & Plan Note (Signed)
Patient back in sinus rhythm today. Continue Cardizem and aspirin. Patient is scheduled to have EGD and colonoscopy tomorrow. He will see his primary care physician on Wednesday at the [facility]. Once his GI tract is cleared it would be reasonable for him to see electrophysiology for consideration of atrial flutter ablation. This would allow him to avoid anticoagulation in the future and reduce the risk of embolic CVA. He would like to have this at the [facility] if possible for financial reasons.

## 2012-09-03 NOTE — Assessment & Plan Note (Signed)
Management per gastroenterology. 

## 2012-09-03 NOTE — Progress Notes (Signed)
HPI: Pleasant male for fu of atrial flutter. Seen in the emergency room 07/30/12. His heart rate was rapid in the emergency room. He had not had his morning medications yet. Laboratory data demonstrated microcytic anemia. Renal function noted to be normal. Chest x-ray without acute disease. Chest pain was felt to be atypical. Cardiac markers remained negative. No further ischemic evaluation was recommended. Cardizem was resumed for rate control. CHADS2 score 1 with hypertension. It was felt that he would most likely need consideration of atrial flutter ablation and short-term anticoagulation following the procedure at some point. However, with his anemia, it was felt that this needed to be evaluated by GI first. Records reveal patient had an adenomatous polyp removed at the [facility] in 2012. Note echocardiogram September 2013 showed normal LV function, moderate to severe left ventricular hypertrophy and a slight intracavitary LV gradient. There was moderate biatrial enlargement. There was mild tricuspid regurgitation. TSH in September of 2013 was normal. Patient seen by GI and noted to have active hepatitis C. He is scheduled for EGD and colonoscopy tomorrow. He is to follow up with his primary care physician at the [facility] tomorrow. Since he was last seen, he denies dyspnea, chest pain, palpitations or syncope.   Current Outpatient Prescriptions  Medication Sig Dispense Refill  . allopurinol (ZYLOPRIM) 300 MG tablet Take 300 mg by mouth daily.      Marland Kitchen aspirin EC 325 MG tablet Take 325 mg by mouth daily.      . colchicine 0.6 MG tablet Take 0.6 mg by mouth daily.      . cyclobenzaprine (FLEXERIL) 10 MG tablet Take 10 mg by mouth at bedtime.      Marland Kitchen diltiazem (TIAZAC) 420 MG 24 hr capsule Take 1 capsule (420 mg total) by mouth daily.  30 capsule  1  . lisinopril-hydrochlorothiazide (PRINZIDE,ZESTORETIC) 20-12.5 MG per tablet Take 1 tablet by mouth daily.      . Na Sulfate-K Sulfate-Mg Sulf SOLN Take 1 kit by  mouth once.  354 mL  0  . omeprazole (PRILOSEC) 20 MG capsule Take 20 mg by mouth daily.      . sennosides-docusate sodium (SENOKOT-S) 8.6-50 MG tablet Take 2 tablets by mouth daily.      . sertraline (ZOLOFT) 100 MG tablet Take 100 mg by mouth daily.      . traZODone (DESYREL) 100 MG tablet Take 100-200 mg by mouth 3 times/day as needed-between meals & bedtime. For sleep         Past Medical History  Diagnosis Date  . Hypertension   . Atrial flutter   . Peripheral neuropathy   . Gout   . Microcytic anemia     colo at [facility] 2012 "ok" per patient  . Thrombocytopenia     evaluated by Heme in past   . Hepatitis C   . Colon polyp   . Hx of echocardiogram     Echo 9/13:  mod to severe LVH, EF 70%, no MV SAM, no LVOT gradient, slight intracavitary LV gradient, mod BAE, mild TR, PASP 33    Past Surgical History  Procedure Date  . Revision total knee arthroplasty     Both  . Back surgery   . Cervical disc surgery     History   Social History  . Marital Status: Married    Spouse Name: N/A    Number of Children: 2  . Years of Education: N/A   Occupational History  . Retired  Retired   Social History Main Topics  . Smoking status: Former Games developer  . Smokeless tobacco: Never Used  . Alcohol Use: Yes     Occasional  . Drug Use: Not on file  . Sexually Active: Not on file   Other Topics Concern  . Not on file   Social History Narrative  . No narrative on file    ROS: no fevers or chills, productive cough, hemoptysis, dysphasia, odynophagia, melena, hematochezia, dysuria, hematuria, rash, seizure activity, orthopnea, PND, pedal edema, claudication. Remaining systems are negative.  Physical Exam: Well-developed well-nourished in no acute distress.  Skin is warm and dry.  HEENT is normal.  Neck is supple.  Chest is clear to auscultation with normal expansion.  Cardiovascular exam is regular rate and rhythm.  Abdominal exam nontender or distended. No masses  palpated. Extremities show no edema. neuro grossly intact  Sinus rhythm at a rate of 95. No ST changes.

## 2012-09-03 NOTE — Assessment & Plan Note (Signed)
Blood pressure is elevated but he has not taken his medications today. Continue present medications and adjust as needed.

## 2012-09-04 ENCOUNTER — Ambulatory Visit (AMBULATORY_SURGERY_CENTER): Payer: Medicare Other | Admitting: Gastroenterology

## 2012-09-04 ENCOUNTER — Encounter: Payer: Self-pay | Admitting: Gastroenterology

## 2012-09-04 VITALS — BP 128/75 | HR 60 | Temp 96.8°F | Resp 18 | Ht 71.0 in | Wt 205.0 lb

## 2012-09-04 DIAGNOSIS — Z860100 Personal history of colon polyps, unspecified: Secondary | ICD-10-CM

## 2012-09-04 DIAGNOSIS — D126 Benign neoplasm of colon, unspecified: Secondary | ICD-10-CM

## 2012-09-04 DIAGNOSIS — K2990 Gastroduodenitis, unspecified, without bleeding: Secondary | ICD-10-CM

## 2012-09-04 DIAGNOSIS — K921 Melena: Secondary | ICD-10-CM

## 2012-09-04 DIAGNOSIS — D649 Anemia, unspecified: Secondary | ICD-10-CM

## 2012-09-04 DIAGNOSIS — K2970 Gastritis, unspecified, without bleeding: Secondary | ICD-10-CM

## 2012-09-04 DIAGNOSIS — K2980 Duodenitis without bleeding: Secondary | ICD-10-CM

## 2012-09-04 MED ORDER — SODIUM CHLORIDE 0.9 % IV SOLN: 500.0000 mL | INTRAVENOUS | Status: DC

## 2012-09-04 NOTE — Op Note (Signed)
 [facility] [address] [place], [address]   COLONOSCOPY PROCEDURE REPORT  PATIENT: Clinton Thomas, Clinton Thomas  MR#: [id number] BIRTHDATE: 1946-04-12 , 66  yrs. old GENDER: Male ENDOSCOPIST: Louis Meckel, MD REFERRED [id number] Ludwig Clarks, M.D.  Pierce Crane, M.D. PROCEDURE DATE:  09/04/2012 PROCEDURE:   Colonoscopy with cold biopsy polypectomy ASA CLASS:   Class III INDICATIONS:iron deficiency anemia. MEDICATIONS: MAC sedation, administered by CRNA and propofol (Diprivan) 200mg  IV  DESCRIPTION OF PROCEDURE:   After the risks benefits and alternatives of the procedure were thoroughly explained, informed consent was obtained.  A digital rectal exam revealed no abnormalities of the rectum.   The LB PCF-H180AL [id number]  endoscope was introduced through the anus and advanced to the cecum, which was identified by the ileocecal valve. No adverse events experienced.   Limited by poor preparation.    There was a moderate amount of retained liquid stool.   The quality of the prep was Suprep fair  The instrument was then slowly withdrawn as the colon was fully examined.      COLON FINDINGS: A sessile polyp measuring 2-3 mm in size was found at the cecum.  A polypectomy was performed with a cold snare.  The resection was complete and the polyp tissue was completely retrieved.   Internal hemorrhoids were found.  Retroflexed views revealed no abnormalities. The time to cecum=6 minutes 48 seconds. Withdrawal time=13 minutes 16 seconds.  The scope was withdrawn and the procedure completed. COMPLICATIONS: There were no complications.  ENDOSCOPIC IMPRESSION: 1.   Sessile polyp measuring 2-3 mm in size was found at the cecum; polypectomy was performed with a cold snare 2.   Internal hemorrhoids  RECOMMENDATIONS: 1.  If the polyp(s) removed today are proven to be adenomatous (pre-cancerous) polyps, you will need a repeat colonoscopy in 5 years.  Otherwise you should continue  to follow colorectal cancer screening guidelines for "routine risk" patients with colonoscopy in 10 years.  You will receive a letter within 1-2 weeks with the results of your biopsy as well as final recommendations.  Please call my office if you have not received a letter after 3 weeks. 2.  capsule endoscopy   eSigned:  Louis Meckel, MD 09/04/2012 11:11 AM   cc:

## 2012-09-04 NOTE — Progress Notes (Signed)
Patient did not experience any of the following events: a burn prior to discharge; a fall within the facility; wrong site/side/patient/procedure/implant event; or a hospital transfer or hospital admission upon discharge from the facility. (G8907) Patient did not have preoperative order for IV antibiotic SSI prophylaxis. (G8918)  

## 2012-09-04 NOTE — Op Note (Signed)
 [facility] [address] [place], [address]   ENDOSCOPY PROCEDURE REPORT  PATIENT: Clinton, Thomas  MR#: [id number] BIRTHDATE: 01/18/1946 , 66  yrs. old GENDER: Male ENDOSCOPIST: Louis Meckel, MD REFERRED BY:  Lewayne Bunting, M.D.  Pierce Crane, M.D. PROCEDURE DATE:  09/04/2012 PROCEDURE:  EGD w/ biopsy ASA CLASS:     Class II INDICATIONS:  iron deficiency anemia. MEDICATIONS: There was residual sedation effect present from prior procedure, MAC sedation, administered by CRNA, and propofol (Diprivan) 100mg  IV TOPICAL ANESTHETIC:  DESCRIPTION OF PROCEDURE: After the risks benefits and alternatives of the procedure were thoroughly explained, informed consent was obtained.  The LB GIF-H180 [id number] endoscope was introduced through the mouth and advanced to the third portion of the duodenum. Without limitations.  The instrument was slowly withdrawn as the mucosa was fully examined.    There were 2 very superficial 1 mm clean-based erosions in the gastric body.  In the duodenal bulb there was mild erythema. Biopsies were taken.   The GE junction was very irregular with islands of gastric appearing mucosa within one centimeter of the GE junction.  Biopsies were taken to rule out Barrett's esophagus. The GE junction was very irregular with islands of gastric appearing mucosa within one centimeter of the GE junction. Biopsies were taken to rule out Barrett's esophagus.   The remainder of the upper endoscopy exam was otherwise normal. Retroflexed views revealed no abnormalities.     The scope was then withdrawn from the patient and the procedure completed.  COMPLICATIONS: There were no complications. ENDOSCOPIC IMPRESSION: 1.   There were 2 very superficial 1 mm clean-based erosions in the gastric body.  In the duodenal bulb there was mild erythema. Biopsies were taken. 2.   The GE junction was very irregular with islands of gastric appearing mucosa within  one centimeter of the GE junction. Biopsies were taken to rule out Barrett's esophagus. 3.   The remainder of the upper endoscopy exam was otherwise normal  RECOMMENDATIONS: 1.  Await biopsy findings 2.   capsule endoscopy  REPEAT EXAM:  eSigned:  Louis Meckel, MD 09/04/2012 11:18 AM   CC:  PATIENT NAME:  Clinton Thomas, Clinton Thomas MR#: [id number]

## 2012-09-04 NOTE — Patient Instructions (Addendum)
Impressions/recommendations:  Polyp (handout given) Internal hemorrhoids (handout given)  Small erosions in gastric body GE junctions was irregular  Biopsies taken, await biopsy results. Capsule endoscopy to be scheduled by Dr. Marzetta Board nurse. You should receive a phone call from the office within the next two days. If you have not heard from the office by 4pm Wednesday 09/05/12, please call to inquire about the capsule endoscopy appointment.  YOU HAD AN ENDOSCOPIC PROCEDURE TODAY AT THE [facility]: Refer to the procedure report that was given to you for any specific questions about what was found during the examination.  If the procedure report does not answer your questions, please call your gastroenterologist to clarify.  If you requested that your care partner not be given the details of your procedure findings, then the procedure report has been included in a sealed envelope for you to review at your convenience later.  YOU SHOULD EXPECT: Some feelings of bloating in the abdomen. Passage of more gas than usual.  Walking can help get rid of the air that was put into your GI tract during the procedure and reduce the bloating. If you had a lower endoscopy (such as a colonoscopy or flexible sigmoidoscopy) you may notice spotting of blood in your stool or on the toilet paper. If you underwent a bowel prep for your procedure, then you may not have a normal bowel movement for a few days.  DIET: Your first meal following the procedure should be a light meal and then it is ok to progress to your normal diet.  A half-sandwich or bowl of soup is an example of a good first meal.  Heavy or fried foods are harder to digest and may make you feel nauseous or bloated.  Likewise meals heavy in dairy and vegetables can cause extra gas to form and this can also increase the bloating.  Drink plenty of fluids but you should avoid alcoholic beverages for 24 hours.  ACTIVITY: Your care partner should  take you home directly after the procedure.  You should plan to take it easy, moving slowly for the rest of the day.  You can resume normal activity the day after the procedure however you should NOT DRIVE or use heavy machinery for 24 hours (because of the sedation medicines used during the test).    SYMPTOMS TO REPORT IMMEDIATELY: A gastroenterologist can be reached at any hour.  During normal business hours, 8:30 AM to 5:00 PM Monday through Friday, call [phone].  After hours and on weekends, please call the GI answering service at [phone] who will take a message and have the physician on call contact you.   Following lower endoscopy (colonoscopy or flexible sigmoidoscopy):  Excessive amounts of blood in the stool  Significant tenderness or worsening of abdominal pains  Swelling of the abdomen that is new, acute  Fever of 100F or higher  Following upper endoscopy (EGD)  Vomiting of blood or coffee ground material  New chest pain or pain under the shoulder blades  Painful or persistently difficult swallowing  New shortness of breath  Fever of 100F or higher  Black, tarry-looking stools  FOLLOW UP: If any biopsies were taken you will be contacted by phone or by letter within the next 1-3 weeks.  Call your gastroenterologist if you have not heard about the biopsies in 3 weeks.  Our staff will call the home number listed on your records the next business day following your procedure to check on you and  address any questions or concerns that you may have at that time regarding the information given to you following your procedure. This is a courtesy call and so if there is no answer at the home number and we have not heard from you through the emergency physician on call, we will assume that you have returned to your regular daily activities without incident.  SIGNATURES/CONFIDENTIALITY: You and/or your care partner have signed paperwork which will be entered into your  electronic medical record.  These signatures attest to the fact that that the information above on your After Visit Summary has been reviewed and is understood.  Full responsibility of the confidentiality of this discharge information lies with you and/or your care-partner.

## 2012-09-05 ENCOUNTER — Telehealth: Payer: Self-pay | Admitting: *Deleted

## 2012-09-05 NOTE — Telephone Encounter (Signed)
No answer, unable to leave message related to message center not the same number as provided.

## 2012-09-06 ENCOUNTER — Other Ambulatory Visit: Payer: Self-pay | Admitting: Gastroenterology

## 2012-09-06 DIAGNOSIS — D649 Anemia, unspecified: Secondary | ICD-10-CM

## 2012-09-17 ENCOUNTER — Encounter: Payer: Self-pay | Admitting: Gastroenterology

## 2012-09-21 ENCOUNTER — Telehealth: Payer: Self-pay | Admitting: Gastroenterology

## 2012-09-21 ENCOUNTER — Other Ambulatory Visit: Payer: Self-pay | Admitting: *Deleted

## 2012-09-21 ENCOUNTER — Telehealth: Payer: Self-pay | Admitting: *Deleted

## 2012-09-21 DIAGNOSIS — A048 Other specified bacterial intestinal infections: Secondary | ICD-10-CM

## 2012-09-21 MED ORDER — AMOXICILL-CLARITHRO-LANSOPRAZ PO MISC: Freq: Two times a day (BID) | ORAL | Status: DC

## 2012-09-21 NOTE — Progress Notes (Signed)
Spoke with the patient at 1700.  He was quite upset , because his prevpac wasn't called into the [facility].  He was also upset that the doctor sent a letter to tell him about his infection.  I calmed him down and assured him that I would get the prescription called in today.  The patient was also upset, because the prevpac costs about $400.  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   [facility] [place] [facility] the patient , and the pharmacist refused to fill the script due to the fact that our doctor wasn't part of the [place].   At this time, I called the [facility] pharmacy and spoke with one of the pharmacists.  She was very helpful, and stated that they could help the patient.   By the time  I had to call the patient back, the cost had decreased by $220.  I called the patient back, and he was still concerned about the price.  I explained that the [place] said that he had to come through their ER in order to get his medication.  I urged him to please get his medication at our pharmacy, and that that he needed the meds due to his infection in his stomach.   He took down the address and the phone number of our pharmacy.  He thanked me for my time.

## 2012-09-21 NOTE — Telephone Encounter (Signed)
Spoke with pt's wife about H pylori results and that Prev pack RX sent to pharmacy

## 2012-09-21 NOTE — Telephone Encounter (Signed)
Clinton Quin, Do you know if this pt has had anything for hpylori? I looked in notes

## 2012-09-24 ENCOUNTER — Telehealth: Payer: Self-pay | Admitting: *Deleted

## 2012-09-24 NOTE — Telephone Encounter (Signed)
Opened in error

## 2012-12-07 ENCOUNTER — Ambulatory Visit (INDEPENDENT_AMBULATORY_CARE_PROVIDER_SITE_OTHER): Payer: Medicare Other | Admitting: Cardiology

## 2012-12-07 ENCOUNTER — Encounter: Payer: Self-pay | Admitting: Cardiology

## 2012-12-07 VITALS — BP 130/70 | HR 74 | Resp 18 | Ht 73.0 in | Wt 192.8 lb

## 2012-12-07 DIAGNOSIS — Z8619 Personal history of other infectious and parasitic diseases: Secondary | ICD-10-CM

## 2012-12-07 DIAGNOSIS — I4892 Unspecified atrial flutter: Secondary | ICD-10-CM

## 2012-12-07 DIAGNOSIS — I10 Essential (primary) hypertension: Secondary | ICD-10-CM

## 2012-12-07 NOTE — Assessment & Plan Note (Signed)
Blood pressure controlled. Continue present medications. 

## 2012-12-07 NOTE — Progress Notes (Signed)
HPI: Pleasant male for fu of atrial flutter. Seen in the emergency room 07/30/12. His heart rate was rapid in the emergency room. He had not had his morning medications yet. Laboratory data demonstrated microcytic anemia. Renal function noted to be normal. Chest x-ray without acute disease. Chest pain was felt to be atypical. Cardiac markers remained negative. No further ischemic evaluation was recommended. Cardizem was resumed for rate control. CHADS2 score 1 with hypertension. It was felt that he would most likely need consideration of atrial flutter ablation and short-term anticoagulation at some point. However, with his anemia, it was felt that this needed to be evaluated by GI first. Records reveal patient had an adenomatous polyp removed at the [facility] in 2012. Note echocardiogram September 2013 showed normal LV function, moderate to severe left ventricular hypertrophy and a slight intracavitary LV gradient. There was moderate biatrial enlargement. There was mild tricuspid regurgitation. TSH in September of 2013 was normal. Patient seen by GI and noted to have active hepatitis C. He was scheduled for EGD and colonoscopy. This revealed one polyp which was removed and hemorrhoids. He also had note of an erosion on EGD and biopsies were obtained. Since he was last seen in Oct 2013, he denies dyspnea, chest pain, palpitations or syncope.   Current Outpatient Prescriptions  Medication Sig Dispense Refill  . allopurinol (ZYLOPRIM) 300 MG tablet Take 300 mg by mouth daily.      Marland Kitchen aspirin EC 325 MG tablet Take 325 mg by mouth daily.      . colchicine 0.6 MG tablet Take 0.6 mg by mouth daily.      . cyclobenzaprine (FLEXERIL) 10 MG tablet Take 10 mg by mouth at bedtime.      Marland Kitchen diltiazem (TIAZAC) 420 MG 24 hr capsule Take 1 capsule (420 mg total) by mouth daily.  30 capsule  1  . lisinopril-hydrochlorothiazide (PRINZIDE,ZESTORETIC) 20-12.5 MG per tablet Take 1 tablet by mouth daily.      Marland Kitchen omeprazole  (PRILOSEC) 20 MG capsule Take 20 mg by mouth daily.      . sennosides-docusate sodium (SENOKOT-S) 8.6-50 MG tablet Take 2 tablets by mouth daily.      . sertraline (ZOLOFT) 100 MG tablet Take 100 mg by mouth daily.      . traZODone (DESYREL) 100 MG tablet Take 100-200 mg by mouth 3 times/day as needed-between meals & bedtime. For sleep      . amoxicillin-clarithromycin-lansoprazole (PREVPAC) combo pack Take by mouth 2 (two) times daily. Follow package directions.  1 kit  0     Past Medical History  Diagnosis Date  . Hypertension   . Atrial flutter   . Peripheral neuropathy   . Gout   . Microcytic anemia     colo at [facility] 2012 "ok" per patient  . Thrombocytopenia     evaluated by Heme in past   . Hepatitis C   . Colon polyp   . Hx of echocardiogram     Echo 9/13:  mod to severe LVH, EF 70%, no MV SAM, no LVOT gradient, slight intracavitary LV gradient, mod BAE, mild TR, PASP 33    Past Surgical History  Procedure Date  . Revision total knee arthroplasty     Both  . Back surgery   . Cervical disc surgery     History   Social History  . Marital Status: Married    Spouse Name: N/A    Number of Children: 2  . Years of Education:  N/A   Occupational History  . Retired     Retired   Social History Main Topics  . Smoking status: Former Games developer  . Smokeless tobacco: Never Used  . Alcohol Use: Yes     Comment: Occasional  . Drug Use: Not on file  . Sexually Active: Not on file   Other Topics Concern  . Not on file   Social History Narrative  . No narrative on file    ROS: no fevers or chills, productive cough, hemoptysis, dysphasia, odynophagia, melena, hematochezia, dysuria, hematuria, rash, seizure activity, orthopnea, PND, pedal edema, claudication. Remaining systems are negative.  Physical Exam: Well-developed well-nourished in no acute distress.  Skin is warm and dry.  HEENT is normal.  Neck is supple.  Chest is clear to auscultation with normal  expansion.  Cardiovascular exam is regular rate and rhythm.  Abdominal exam nontender or distended. No masses palpated. Extremities show no edema. neuro grossly intact  ECG sinus rhythm at a rate of 70. No ST changes.

## 2012-12-07 NOTE — Assessment & Plan Note (Signed)
Management per gastroenterology. 

## 2012-12-07 NOTE — Patient Instructions (Addendum)
Your physician recommends that you schedule a follow-up appointment in: AS NEEDED  

## 2012-12-07 NOTE — Assessment & Plan Note (Signed)
Patient remains in sinus rhythm. Continue aspirin and Cardizem. Chads vasc score 2 for HTN and age 67. I have explained the risk of an embolic event and I think it would be worthwhile to consider ablation of his flutter to decrease this risk in the future. Do to financial reasons he would prefer to have this at the [facility]. He will discuss this with cardiology there.

## 2013-01-05 ENCOUNTER — Other Ambulatory Visit: Payer: Self-pay

## 2013-06-26 ENCOUNTER — Other Ambulatory Visit: Payer: Self-pay

## 2013-09-10 ENCOUNTER — Emergency Department (HOSPITAL_COMMUNITY)
Admission: EM | Admit: 2013-09-10 | Discharge: 2013-09-10 | Disposition: A | Discharge status: Home / Self Care | Payer: Medicare Other | Attending: Emergency Medicine | Admitting: Emergency Medicine

## 2013-09-10 ENCOUNTER — Emergency Department (HOSPITAL_COMMUNITY): Payer: Medicare Other

## 2013-09-10 ENCOUNTER — Encounter (HOSPITAL_COMMUNITY): Payer: Self-pay | Admitting: Emergency Medicine

## 2013-09-10 DIAGNOSIS — Z888 Allergy status to other drugs, medicaments and biological substances status: Secondary | ICD-10-CM | POA: Insufficient documentation

## 2013-09-10 DIAGNOSIS — R0602 Shortness of breath: Secondary | ICD-10-CM | POA: Insufficient documentation

## 2013-09-10 DIAGNOSIS — I4892 Unspecified atrial flutter: Secondary | ICD-10-CM | POA: Insufficient documentation

## 2013-09-10 DIAGNOSIS — Z860100 Personal history of colon polyps, unspecified: Secondary | ICD-10-CM | POA: Insufficient documentation

## 2013-09-10 DIAGNOSIS — Z9189 Other specified personal risk factors, not elsewhere classified: Secondary | ICD-10-CM | POA: Insufficient documentation

## 2013-09-10 DIAGNOSIS — R61 Generalized hyperhidrosis: Secondary | ICD-10-CM | POA: Insufficient documentation

## 2013-09-10 DIAGNOSIS — M109 Gout, unspecified: Secondary | ICD-10-CM | POA: Insufficient documentation

## 2013-09-10 DIAGNOSIS — Z87891 Personal history of nicotine dependence: Secondary | ICD-10-CM | POA: Insufficient documentation

## 2013-09-10 DIAGNOSIS — D509 Iron deficiency anemia, unspecified: Secondary | ICD-10-CM | POA: Insufficient documentation

## 2013-09-10 DIAGNOSIS — R5381 Other malaise: Secondary | ICD-10-CM | POA: Insufficient documentation

## 2013-09-10 DIAGNOSIS — R42 Dizziness and giddiness: Secondary | ICD-10-CM | POA: Insufficient documentation

## 2013-09-10 DIAGNOSIS — Z7982 Long term (current) use of aspirin: Secondary | ICD-10-CM | POA: Insufficient documentation

## 2013-09-10 DIAGNOSIS — D696 Thrombocytopenia, unspecified: Secondary | ICD-10-CM | POA: Insufficient documentation

## 2013-09-10 DIAGNOSIS — Z79899 Other long term (current) drug therapy: Secondary | ICD-10-CM | POA: Insufficient documentation

## 2013-09-10 DIAGNOSIS — I10 Essential (primary) hypertension: Secondary | ICD-10-CM | POA: Insufficient documentation

## 2013-09-10 DIAGNOSIS — G609 Hereditary and idiopathic neuropathy, unspecified: Secondary | ICD-10-CM | POA: Insufficient documentation

## 2013-09-10 DIAGNOSIS — B1920 Unspecified viral hepatitis C without hepatic coma: Secondary | ICD-10-CM | POA: Insufficient documentation

## 2013-09-10 LAB — URINALYSIS, ROUTINE W REFLEX MICROSCOPIC
Hgb urine dipstick: NEGATIVE
Ketones, ur: 15 mg/dL — AB
Nitrite: NEGATIVE
Protein, ur: 100 mg/dL — AB
pH: 5.5 (ref 5.0–8.0)

## 2013-09-10 LAB — CBC
HCT: 34.8 % — ABNORMAL LOW (ref 39.0–52.0)
Hemoglobin: 11.3 g/dL — ABNORMAL LOW (ref 13.0–17.0)
MCH: 21.9 pg — ABNORMAL LOW (ref 26.0–34.0)
MCHC: 32.5 g/dL (ref 30.0–36.0)
MCV: 67.6 fL — ABNORMAL LOW (ref 78.0–100.0)

## 2013-09-10 LAB — BASIC METABOLIC PANEL
Calcium: 9.9 mg/dL (ref 8.4–10.5)
Creatinine, Ser: 0.88 mg/dL (ref 0.50–1.35)
GFR calc Af Amer: >90 mL/min (ref >90)

## 2013-09-10 LAB — URINE MICROSCOPIC-ADD ON

## 2013-09-10 LAB — POCT I-STAT TROPONIN I
Troponin i, poc: 0 ng/mL (ref 0.00–0.08)
Troponin i, poc: 0 ng/mL (ref 0.00–0.08)

## 2013-09-10 MED ORDER — DILTIAZEM HCL 25 MG/5ML IV SOLN: 10.0000 mg | Freq: Once | INTRAVENOUS | Status: DC

## 2013-09-10 MED ORDER — DILTIAZEM HCL 25 MG/5ML IV SOLN
5.0000 mg | Freq: Once | INTRAVENOUS | Status: AC
  Administered 2013-09-10: 5 mg via INTRAVENOUS

## 2013-09-10 MED ORDER — DILTIAZEM HCL 25 MG/5ML IV SOLN
5.0000 mg | Freq: Once | INTRAVENOUS | Status: AC
  Administered 2013-09-10: 5 mg via INTRAVENOUS
  Filled 2013-09-10: qty 5

## 2013-09-10 MED ORDER — SODIUM CHLORIDE 0.9 % IV BOLUS (SEPSIS): 1000.0000 mL | Freq: Once | INTRAVENOUS | Status: DC

## 2013-09-10 MED ORDER — SODIUM CHLORIDE 0.9 % IV BOLUS (SEPSIS)
500.0000 mL | Freq: Once | INTRAVENOUS | Status: AC
  Administered 2013-09-10: 19:00:00 via INTRAVENOUS

## 2013-09-10 NOTE — ED Notes (Signed)
MD at bedside. 

## 2013-09-10 NOTE — ED Notes (Signed)
Pt reports while at the park with his great granddaughter he developed a sudden onset of dizziness, SOB, weakness, and diaphoretic. Pt reports he did push his great granddaughter a few times on the swing prior to the onset of dizziness. Pt denies actual chest pain, N/V, cough or congestion

## 2013-09-10 NOTE — ED Notes (Signed)
Pt tp ED via [facility], pt was c/o dizziness and feeling diaphoretic, pt with a hx of A-flutter and was in A-flutter upon EMS arrival

## 2013-09-10 NOTE — ED Provider Notes (Signed)
CSN: [id number]     Arrival date & time 09/10/13  1839 History   First MD Initiated Contact with Patient 09/10/13 1842     Chief Complaint  Patient presents with  . Atrial Flutter  . Dizziness   (Consider location/radiation/quality/duration/timing/severity/associated sxs/prior Treatment) Patient is a 67 y.o. male presenting with general illness.  Illness Location:  Generalized Quality:  Diaphoresis, malaise Severity:  Moderate Onset quality:  Gradual Duration:  1 hour Timing:  Constant Progression:  Resolved Chronicity:  Recurrent Context:  Aflutter, while playing with child Relieved by:  Nothing Worsened by:  Nothing Associated symptoms: shortness of breath   Associated symptoms: no abdominal pain, no chest pain, no congestion, no cough, no diarrhea, no fever, no headaches, no loss of consciousness, no nausea, no rash, no rhinorrhea, no sore throat and no vomiting     Past Medical History  Diagnosis Date  . Hypertension   . Atrial flutter   . Peripheral neuropathy   . Gout   . Microcytic anemia     colo at [facility] 2012 "ok" per patient  . Thrombocytopenia     evaluated by Heme in past   . Hepatitis C   . Colon polyp   . Hx of echocardiogram     Echo 9/13:  mod to severe LVH, EF 70%, no MV SAM, no LVOT gradient, slight intracavitary LV gradient, mod BAE, mild TR, PASP 33   Past Surgical History  Procedure Laterality Date  . Revision total knee arthroplasty      Both  . Back surgery    . Cervical disc surgery     Family History  Problem Relation Age of Onset  . Stroke Mother    History  Substance Use Topics  . Smoking status: Former Games developer  . Smokeless tobacco: Never Used  . Alcohol Use: Yes     Comment: Occasional    Review of Systems  Constitutional: Negative for fever and chills.  HENT: Negative for congestion, rhinorrhea and sore throat.   Eyes: Negative for photophobia and visual disturbance.  Respiratory: Positive for shortness of breath.  Negative for cough.   Cardiovascular: Negative for chest pain and leg swelling.  Gastrointestinal: Negative for nausea, vomiting, abdominal pain, diarrhea and constipation.  Endocrine: Negative for polydipsia and polyuria.  Genitourinary: Negative for dysuria and hematuria.  Musculoskeletal: Negative for arthralgias and back pain.  Skin: Negative for color change and rash.  Neurological: Negative for dizziness, loss of consciousness, syncope, light-headedness and headaches.  Hematological: Negative for adenopathy. Does not bruise/bleed easily.  All other systems reviewed and are negative.    Allergies  Linezolid  Home Medications   Current Outpatient Rx  Name  Route  Sig  Dispense  Refill  . allopurinol (ZYLOPRIM) 300 MG tablet   Oral   Take 300 mg by mouth daily.         Marland Kitchen aspirin EC 325 MG tablet   Oral   Take 325 mg by mouth daily.         . colchicine 0.6 MG tablet   Oral   Take 0.6 mg by mouth daily as needed (for gout).          . cyclobenzaprine (FLEXERIL) 10 MG tablet   Oral   Take 10 mg by mouth at bedtime.         Marland Kitchen diltiazem (DILACOR XR) 240 MG 24 hr capsule   Oral   Take 240 mg by mouth daily.         Marland Kitchen  ferrous sulfate 325 (65 FE) MG tablet   Oral   Take 325 mg by mouth daily with breakfast.         . lisinopril (PRINIVIL,ZESTRIL) 40 MG tablet   Oral   Take 40 mg by mouth daily.         . sennosides-docusate sodium (SENOKOT-S) 8.6-50 MG tablet   Oral   Take 2 tablets by mouth every morning.          . sertraline (ZOLOFT) 100 MG tablet   Oral   Take 100 mg by mouth daily.         . vitamin C (ASCORBIC ACID) 500 MG tablet   Oral   Take 500 mg by mouth 2 (two) times daily as needed.          BP 140/79  Pulse 61  Temp(Src) 97.5 F (36.4 C) (Oral)  Resp 18  SpO2 98% Physical Exam  Vitals reviewed. Constitutional: He is oriented to person, place, and time. He appears well-developed and well-nourished.  HENT:  Head:  Normocephalic and atraumatic.  Eyes: Conjunctivae and EOM are normal.  Neck: Normal range of motion. Neck supple.  Cardiovascular: Normal rate, regular rhythm and normal heart sounds.   Pulmonary/Chest: Effort normal and breath sounds normal. No respiratory distress.  Abdominal: He exhibits no distension. There is no tenderness. There is no rebound and no guarding.  Musculoskeletal: Normal range of motion.  Neurological: He is alert and oriented to person, place, and time.  Skin: Skin is warm and dry.    ED Course  Procedures (including critical care time) Labs Review Labs Reviewed  CBC - Abnormal; Notable for the following:    Hemoglobin 11.3 (*)    HCT 34.8 (*)    MCV 67.6 (*)    MCH 21.9 (*)    RDW 22.5 (*)    Platelets 111 (*)    All other components within normal limits  BASIC METABOLIC PANEL - Abnormal; Notable for the following:    Glucose, Bld 64 (*)    GFR calc non Af Amer 88 (*)    All other components within normal limits  URINALYSIS, ROUTINE W REFLEX MICROSCOPIC - Abnormal; Notable for the following:    Ketones, ur 15 (*)    Protein, ur 100 (*)    Leukocytes, UA TRACE (*)    All other components within normal limits  PRO B NATRIURETIC PEPTIDE - Abnormal; Notable for the following:    Pro B Natriuretic peptide (BNP) 568.2 (*)    All other components within normal limits  URINE MICROSCOPIC-ADD ON - Abnormal; Notable for the following:    Casts HYALINE CASTS (*)    All other components within normal limits  POCT I-STAT TROPONIN I  POCT I-STAT TROPONIN I   Results for orders placed during the hospital encounter of 09/10/13  CBC      Result Value Range   WBC 6.4  4.0 - 10.5 K/uL   RBC 5.15  4.22 - 5.81 MIL/uL   Hemoglobin 11.3 (*) 13.0 - 17.0 g/dL   HCT 16.1 (*) 09.6 - 04.5 %   MCV 67.6 (*) 78.0 - 100.0 fL   MCH 21.9 (*) 26.0 - 34.0 pg   MCHC 32.5  30.0 - 36.0 g/dL   RDW 40.9 (*) 81.1 - 91.4 %   Platelets 111 (*) 150 - 400 K/uL  BASIC METABOLIC PANEL       Result Value Range   Sodium 139  135 - 145 mEq/L  Potassium 4.0  3.5 - 5.1 mEq/L   Chloride 106  96 - 112 mEq/L   CO2 21  19 - 32 mEq/L   Glucose, Bld 64 (*) 70 - 99 mg/dL   BUN 14  6 - 23 mg/dL   Creatinine, Ser 1.91  0.50 - 1.35 mg/dL   Calcium 9.9  8.4 - 47.8 mg/dL   GFR calc non Af Amer 88 (*) >90 mL/min   GFR calc Af Amer >90  >90 mL/min  URINALYSIS, ROUTINE W REFLEX MICROSCOPIC      Result Value Range   Color, Urine YELLOW  YELLOW   APPearance CLEAR  CLEAR   Specific Gravity, Urine 1.017  1.005 - 1.030   pH 5.5  5.0 - 8.0   Glucose, UA NEGATIVE  NEGATIVE mg/dL   Hgb urine dipstick NEGATIVE  NEGATIVE   Bilirubin Urine NEGATIVE  NEGATIVE   Ketones, ur 15 (*) NEGATIVE mg/dL   Protein, ur 295 (*) NEGATIVE mg/dL   Urobilinogen, UA 1.0  0.0 - 1.0 mg/dL   Nitrite NEGATIVE  NEGATIVE   Leukocytes, UA TRACE (*) NEGATIVE  PRO B NATRIURETIC PEPTIDE      Result Value Range   Pro B Natriuretic peptide (BNP) 568.2 (*) 0 - 125 pg/mL  URINE MICROSCOPIC-ADD ON      Result Value Range   Squamous Epithelial / LPF RARE  RARE   WBC, UA 0-2  <3 WBC/hpf   Bacteria, UA RARE  RARE   Casts HYALINE CASTS (*) NEGATIVE  POCT I-STAT TROPONIN I      Result Value Range   Troponin i, poc 0.00  0.00 - 0.08 ng/mL   Comment 3           POCT I-STAT TROPONIN I      Result Value Range   Troponin i, poc 0.00  0.00 - 0.08 ng/mL   Comment 3             Imaging Review Dg Chest 2 View  09/10/2013   CLINICAL DATA:  Atrial fibrillation, dizziness, short of breath  EXAM: CHEST  2 VIEW  COMPARISON:  07/30/2012  FINDINGS: Defibrillator pad overlies normal cardiac silhouette. No effusion, infiltrate, or pneumothorax. Degenerative osteophytosis of the thoracic spine.  IMPRESSION: No acute cardiopulmonary process.   Electronically Signed   By: Genevive Bi M.D.   On: 09/10/2013 19:57    EKG Interpretation   None       Date: 09/10/2013  Rate: 60  Rhythm:a flutter  QRS Axis: normal  Intervals: normal   ST/T Wave abnormalities: normal  Conduction Disutrbances: 3:1 conductance  Narrative Interpretation: A flutter with 3:1 conductance for ventricular rate of 60  Old EKG Reviewed: Atrial flutter has replaced sinus rhythm    MDM   1. Atrial flutter    67 y.o. male  with pertinent PMH of aflutter, anemia, Hep C presents with lightheadedness and diaphoresis beginning approximately 1 hour prior to arrival. Patient has history of similar symptoms in the past with atrial flutter, however states he is not only in the same. Minimal records indicated the patient had at least 2 episodes of atrial flutter in the past for which she was placed on diltiazem and spontaneously resolved. He frankly denies chest pain or shortness of breath. On EMS arrival today patient with atrial flutter with 4-1 conductance with a ventricular rate of around 70. On arrival to ED EKG with similar findings. No ST elevation. Vital signs as above unremarkable. Patient is without symptoms  at time of my exam.  Patient was given 5 mg of diltiazem and converted to normal sinus rhythm for approximately 20 minutes, however unfortunately he reverted back to atrial flutter.  Spoke with cardiology who feel that patient is stable to discharge and followup for possible ablation as an outpatient. As he has been without symptoms throughout the entirety of his ER stay I agree with his assessment.  Patient DC home in stable condition, has home diltiazem.    Labs and imaging as above reviewed by myself and attending,Dr. Juleen China, with whom case was discussed.   1. Atrial flutter         Noel Gerold, MD 09/10/13 1610  Raeford Razor, MD 12/08/14 [phone]

## 2013-09-12 NOTE — ED Provider Notes (Signed)
I saw and evaluated the patient, reviewed the resident's note and I agree with the findings and plan.  EKG Interpretation     Ventricular Rate:  62 PR Interval:    QRS Duration: 168 QT Interval:  530 QTC Calculation: 539 R Axis:   27 Text Interpretation:  Atrial flutter with predominant 4:1 AV block           67 year old male with a flutter. Rate controlled primarily 4-1 block. History of the same. Has cardiology followup. Patient essentially asymptomatic. Outpatient followup for further evaluation and possible ablation.  Raeford Razor, MD 09/12/13 1430

## 2013-09-13 ENCOUNTER — Encounter: Payer: Self-pay | Admitting: Cardiology

## 2013-09-13 ENCOUNTER — Encounter (HOSPITAL_COMMUNITY): Payer: Self-pay | Admitting: *Deleted

## 2013-09-13 ENCOUNTER — Ambulatory Visit (INDEPENDENT_AMBULATORY_CARE_PROVIDER_SITE_OTHER): Payer: Medicare Other | Admitting: Cardiology

## 2013-09-13 VITALS — BP 160/80 | HR 68 | Ht 73.0 in | Wt 206.0 lb

## 2013-09-13 DIAGNOSIS — D649 Anemia, unspecified: Secondary | ICD-10-CM

## 2013-09-13 DIAGNOSIS — Z7901 Long term (current) use of anticoagulants: Secondary | ICD-10-CM | POA: Insufficient documentation

## 2013-09-13 DIAGNOSIS — Z862 Personal history of diseases of the blood and blood-forming organs and certain disorders involving the immune mechanism: Secondary | ICD-10-CM

## 2013-09-13 DIAGNOSIS — R42 Dizziness and giddiness: Secondary | ICD-10-CM

## 2013-09-13 DIAGNOSIS — D696 Thrombocytopenia, unspecified: Secondary | ICD-10-CM

## 2013-09-13 DIAGNOSIS — I4892 Unspecified atrial flutter: Secondary | ICD-10-CM

## 2013-09-13 MED ORDER — RIVAROXABAN 20 MG PO TABS: 20.0000 mg | ORAL_TABLET | Freq: Every day | ORAL | Status: DC

## 2013-09-13 NOTE — Patient Instructions (Addendum)
Xarelto 20 mg daily to prevent blood clots.  Call if any chest pain or shortness of breath.  We will schedule you a stress test.  We will obtain records fro [facility].  We will send you to see Dr. Johney Frame for ablation of the atrial flutter.     Have lab work done in 1 week  Stop the ASA while on Xarelto

## 2013-09-13 NOTE — Assessment & Plan Note (Signed)
Begin Xarelto, with plan for ablation.  Recent GI workup at [place] per the pt and was stable.  Will obtain records.  CBC next week.  Belenda Cruise our pharmacists discussed with the pt.  Samples were given.  CHA2DS2VASc - 2

## 2013-09-13 NOTE — Assessment & Plan Note (Addendum)
Microcytic anemia, with normal GI work up at [place], we have requested records. Check cbc in 1 week on Xarelto.

## 2013-09-13 NOTE — Addendum Note (Signed)
Addended by: Leone Brand on: 09/13/2013 04:56 PM   Modules accepted: Level of Service

## 2013-09-13 NOTE — Assessment & Plan Note (Signed)
plts 111 this week

## 2013-09-13 NOTE — Progress Notes (Signed)
09/13/2013   PCP: Provider Not In System   Chief Complaint  Patient presents with  . Irregular Heart Beat    pt. was seen in [facility] yesterday     Primary Cardiologist: Dr. Jens Som  HPI:  67 year old AAM, [place], presents after ER visit on the 21st of Oct. for weakness, near syncope due to atrial fib and associated SOB.  No chest pain.  HR was 60 in ER.  IV Cardizem given with conversion to SR then we went back into A. Flutter, rate controlled.  He was discharged and returns today for follow up.  He continues in A. Flutter rate controlled. No chest pain or SOB, no further weakness.    This is 3rd episode of  A flutter. He has been seen at [facility] and with Dr. Jens Som in Jan. Of this year.  He believes he has had echo at [facility].  Due to hx of anemia, microcytic, thrombocytopenia, polyps and Hepatitis C, Dr. Jens Som wanted GI workup before sending for ablation.  Pt tells be he had this done and everything was stable.     Pt is on Cardizem currently at lower dose than in Jan.  His HR with a flutter is controlled.  Discussed with Dr. Allyson Sabal, my office MD, and plans will be to do [facility] to rule out ischmia, echo in not done at [facility], and begin Xarelto with close watch of H/H.  Will refer to Dr. Johney Frame.  If stress test is positive will have him follow up with Dr. Jens Som first.   Allergies  Allergen Reactions  . Linezolid Other (See Comments)    unknown    Current Outpatient Prescriptions  Medication Sig Dispense Refill  . allopurinol (ZYLOPRIM) 300 MG tablet Take 300 mg by mouth daily.      . colchicine 0.6 MG tablet Take 0.6 mg by mouth daily as needed (for gout).       . cyclobenzaprine (FLEXERIL) 10 MG tablet Take 10 mg by mouth at bedtime.      Marland Kitchen diltiazem (DILACOR XR) 240 MG 24 hr capsule Take 240 mg by mouth daily.      . ferrous sulfate 325 (65 FE) MG tablet Take 325 mg by mouth daily with breakfast.      . lisinopril  (PRINIVIL,ZESTRIL) 40 MG tablet Take 40 mg by mouth daily.      . sennosides-docusate sodium (SENOKOT-S) 8.6-50 MG tablet Take 2 tablets by mouth every morning.       . sertraline (ZOLOFT) 100 MG tablet Take 100 mg by mouth daily.      . vitamin C (ASCORBIC ACID) 500 MG tablet Take 500 mg by mouth 2 (two) times daily as needed.      . Rivaroxaban (XARELTO) 20 MG TABS tablet Take 1 tablet (20 mg total) by mouth daily.  30 tablet  6   No current facility-administered medications for this visit.    Past Medical History  Diagnosis Date  . Hypertension   . Atrial flutter   . Peripheral neuropathy   . Gout   . Microcytic anemia     colo at [facility] 2012 "ok" per patient  . Thrombocytopenia     evaluated by Heme in past   . Hepatitis C   . Colon polyp   . Hx of echocardiogram     Echo 9/13:  mod to severe LVH, EF 70%, no MV SAM,  no LVOT gradient, slight intracavitary LV gradient, mod BAE, mild TR, PASP 33    Past Surgical History  Procedure Laterality Date  . Revision total knee arthroplasty      Both  . Back surgery    . Cervical disc surgery      ZOX:WRUEAVW:UJ colds or fevers, no weight changes Skin:no rashes or ulcers HEENT:no blurred vision, no congestion CV:see HPI PUL:see HPI GI:no diarrhea constipation or melena, no indigestion GU:no hematuria, no dysuria MS:+ arthritis in both knees, no claudication Neuro:no syncope, no lightheadedness since the 21st Endo:no diabetes, no thyroid disease  PHYSICAL EXAM BP 160/80  Pulse 68  Ht 6\' 1"  (1.854 m)  Wt 206 lb (93.441 kg)  BMI 27.18 kg/m2 General:Pleasant affect, NAD Skin:Warm and dry, brisk capillary refill HEENT:normocephalic, sclera clear, mucus membranes moist Neck:supple, no JVD, no bruits , no adenopathy Heart:S1S2 RRR without murmur, gallup, rub or click Lungs:clear without rales, rhonchi, or wheezes WJX:BJYN, non tender, + BS, do not palpate liver spleen or masses Ext:no lower ext edema, 2+ pedal pulses,  2+ radial pulses Neuro:alert and oriented, MAE, follows commands, + facial symmetry  EKG:a. Flutter rate of 68, varying rates of conduction.  ASSESSMENT AND PLAN Paroxysmal atrial flutter This is 3rd episode of P. A. Flutter for this pt.  Unfortunately he remains in A. Flutter, though rate controlled. He was symptomatic on the 21st with dizziness and weakness calling EMS and going to ER. IV dilt did convert him, but before releasing from ER back in A flutter though rate controlled.  He continues in flutter now without symptoms.  Previously seen by Dr. Jens Som with plans for possible ablation once GI eval was completed.  Pt tells me this was done and he was fine.  Done at [facility].  Pt would prefer ablation to be done here in [place].  We will obtain records from [facility], begin Xarelto in anticipation of ablation, and have pt undergo Lexiscan myoview to rule out ischemia.  Will arrange appt with Dr. Johney Frame for ablation.  Discussed with Dr. Allyson Sabal who was in the office today.    Anemia Microcytic anemia, with normal GI work up at [place], we have requested records. Check cbc in 1 week on Xarelto.  Hx of thrombocytopenia plts 111 this week  On continuous oral anticoagulation, beginning today for possible ablation Begin Xarelto, with plan for ablation.  Recent GI workup at [facility] per the pt and was stable.  Will obtain records.  CBC next week.  Belenda Cruise our pharmacists discussed with the pt.  Samples were given.  CHA2DS2VASc - 2  Though CHADS2 score 1.

## 2013-09-13 NOTE — Assessment & Plan Note (Signed)
This is 3rd episode of P. A. Flutter for this pt.  Unfortunately he remains in A. Flutter, though rate controlled. He was symptomatic on the 21st with dizziness and weakness calling EMS and going to ER. IV dilt did convert him, but before releasing from ER back in A flutter though rate controlled.  He continues in flutter now without symptoms.  Previously seen by Dr. Jens Som with plans for possible ablation once GI eval was completed.  Pt tells me this was done and he was fine.  Done at [facility].  Pt would prefer ablation to be done here in [place].  We will obtain records from [facility], begin Xarelto in anticipation of ablation, and have pt undergo Lexiscan myoview to rule out ischemia.  Will arrange appt with Dr. Johney Frame for ablation.  Discussed with Dr. Allyson Sabal who was in the office today.

## 2013-09-17 ENCOUNTER — Encounter: Payer: Self-pay | Admitting: Cardiology

## 2013-09-19 ENCOUNTER — Encounter (HOSPITAL_COMMUNITY): Payer: Medicare Other

## 2013-09-26 ENCOUNTER — Other Ambulatory Visit: Payer: Self-pay

## 2013-09-26 ENCOUNTER — Telehealth (HOSPITAL_COMMUNITY): Payer: Self-pay | Admitting: *Deleted

## 2013-10-15 ENCOUNTER — Encounter: Payer: Medicare Other | Admitting: Cardiology

## 2013-10-15 NOTE — Progress Notes (Signed)
HPI: FU atrial flutter. Had atrial flutter noted on ER visit 9/13. Echocardiogram September 2013 showed normal LV function, moderate to severe left ventricular hypertrophy and a slight intracavitary LV gradient. There was moderate biatrial enlargement. There was mild tricuspid regurgitation. TSH in September of 2013 was normal. Note patient had microcytic anemia and had GI evaluation prior to consideration of cardiac procedures. At time of last office visit in January of 2014 we felt atrial flutter ablation would be worthwhile as he could discontinue anticoagulation in the future. He preferred to have this performed at the [facility]. Patient seen again in our emergency room in October of 2014 with recurrent atrial flutter. Laboratories continued to show a microcytic anemia with a hemoglobin of 11.3 and MCV 67.6. Patient seen the following day by Nada Boozer. A Myoview was scheduled and patient also asked to see me today.    Current Outpatient Prescriptions  Medication Sig Dispense Refill  . allopurinol (ZYLOPRIM) 300 MG tablet Take 300 mg by mouth daily.      . colchicine 0.6 MG tablet Take 0.6 mg by mouth daily as needed (for gout).       . cyclobenzaprine (FLEXERIL) 10 MG tablet Take 10 mg by mouth at bedtime.      Marland Kitchen diltiazem (DILACOR XR) 240 MG 24 hr capsule Take 240 mg by mouth daily.      . ferrous sulfate 325 (65 FE) MG tablet Take 325 mg by mouth daily with breakfast.      . lisinopril (PRINIVIL,ZESTRIL) 40 MG tablet Take 40 mg by mouth daily.      . Rivaroxaban (XARELTO) 20 MG TABS tablet Take 1 tablet (20 mg total) by mouth daily.  30 tablet  6  . sennosides-docusate sodium (SENOKOT-S) 8.6-50 MG tablet Take 2 tablets by mouth every morning.       . sertraline (ZOLOFT) 100 MG tablet Take 100 mg by mouth daily.      . vitamin C (ASCORBIC ACID) 500 MG tablet Take 500 mg by mouth 2 (two) times daily as needed.       No current facility-administered medications for this visit.      Past Medical History  Diagnosis Date  . Hypertension   . Atrial flutter   . Peripheral neuropathy   . Gout   . Microcytic anemia     colo at [facility] 2012 "ok" per patient  . Thrombocytopenia     evaluated by Heme in past   . Hepatitis C   . Colon polyp   . Hx of echocardiogram     Echo 9/13:  mod to severe LVH, EF 70%, no MV SAM, no LVOT gradient, slight intracavitary LV gradient, mod BAE, mild TR, PASP 33    Past Surgical History  Procedure Laterality Date  . Revision total knee arthroplasty      Both  . Back surgery    . Cervical disc surgery      History   Social History  . Marital Status: Married    Spouse Name: N/A    Number of Children: 2  . Years of Education: N/A   Occupational History  . Retired     Retired   Social History Main Topics  . Smoking status: Former Games developer  . Smokeless tobacco: Never Used  . Alcohol Use: Yes     Comment: Occasional  . Drug Use: Not on file  . Sexual Activity: Not on file   Other Topics Concern  .  Not on file   Social History Narrative  . No narrative on file    ROS: no fevers or chills, productive cough, hemoptysis, dysphasia, odynophagia, melena, hematochezia, dysuria, hematuria, rash, seizure activity, orthopnea, PND, pedal edema, claudication. Remaining systems are negative.  Physical Exam: Well-developed well-nourished in no acute distress.  Skin is warm and dry.  HEENT is normal.  Neck is supple.  Chest is clear to auscultation with normal expansion.  Cardiovascular exam is regular rate and rhythm.  Abdominal exam nontender or distended. No masses palpated. Extremities show no edema. neuro grossly intact  ECG    This encounter was created in error - please disregard.

## 2013-10-25 ENCOUNTER — Encounter: Payer: Self-pay | Admitting: Cardiology

## 2014-01-10 ENCOUNTER — Encounter: Payer: Self-pay | Admitting: Cardiology

## 2015-06-18 ENCOUNTER — Other Ambulatory Visit: Payer: Self-pay | Admitting: Internal Medicine

## 2015-06-18 DIAGNOSIS — B1710 Acute hepatitis C without hepatic coma: Secondary | ICD-10-CM

## 2015-07-08 ENCOUNTER — Ambulatory Visit
Admission: RE | Admit: 2015-07-08 | Discharge: 2015-07-08 | Disposition: A | Discharge status: Home / Self Care | Payer: Self-pay | Source: Ambulatory Visit | Attending: Internal Medicine | Admitting: Internal Medicine

## 2015-07-08 DIAGNOSIS — B1710 Acute hepatitis C without hepatic coma: Secondary | ICD-10-CM

## 2015-09-30 ENCOUNTER — Emergency Department (HOSPITAL_COMMUNITY): Admission: EM | Admit: 2015-09-30 | Discharge: 2015-09-30 | Payer: Self-pay

## 2017-03-31 DIAGNOSIS — H40023 Open angle with borderline findings, high risk, bilateral: Secondary | ICD-10-CM | POA: Diagnosis not present

## 2017-03-31 DIAGNOSIS — H5213 Myopia, bilateral: Secondary | ICD-10-CM | POA: Diagnosis not present

## 2017-03-31 DIAGNOSIS — H2513 Age-related nuclear cataract, bilateral: Secondary | ICD-10-CM | POA: Diagnosis not present

## 2017-04-13 DIAGNOSIS — H401132 Primary open-angle glaucoma, bilateral, moderate stage: Secondary | ICD-10-CM | POA: Diagnosis not present

## 2017-07-18 DIAGNOSIS — I4892 Unspecified atrial flutter: Secondary | ICD-10-CM | POA: Diagnosis not present

## 2017-07-18 DIAGNOSIS — Z8719 Personal history of other diseases of the digestive system: Secondary | ICD-10-CM | POA: Diagnosis not present

## 2017-07-18 DIAGNOSIS — K769 Liver disease, unspecified: Secondary | ICD-10-CM | POA: Diagnosis not present

## 2017-07-31 ENCOUNTER — Emergency Department (HOSPITAL_COMMUNITY)
Admission: EM | Admit: 2017-07-31 | Discharge: 2017-07-31 | Discharge status: Absent without leave | Payer: Medicare Other

## 2017-10-04 DIAGNOSIS — Z711 Person with feared health complaint in whom no diagnosis is made: Secondary | ICD-10-CM | POA: Diagnosis not present

## 2018-02-27 ENCOUNTER — Emergency Department (HOSPITAL_COMMUNITY): Payer: Medicare HMO

## 2018-02-27 ENCOUNTER — Other Ambulatory Visit: Payer: Self-pay

## 2018-02-27 ENCOUNTER — Emergency Department (HOSPITAL_COMMUNITY)
Admission: EM | Admit: 2018-02-27 | Discharge: 2018-02-27 | Disposition: A | Discharge status: Home / Self Care | Payer: Medicare HMO | Attending: Emergency Medicine | Admitting: Emergency Medicine

## 2018-02-27 ENCOUNTER — Encounter (HOSPITAL_COMMUNITY): Payer: Self-pay

## 2018-02-27 DIAGNOSIS — N183 Chronic kidney disease, stage 3 unspecified: Secondary | ICD-10-CM

## 2018-02-27 DIAGNOSIS — I129 Hypertensive chronic kidney disease with stage 1 through stage 4 chronic kidney disease, or unspecified chronic kidney disease: Secondary | ICD-10-CM | POA: Insufficient documentation

## 2018-02-27 DIAGNOSIS — R1084 Generalized abdominal pain: Secondary | ICD-10-CM | POA: Insufficient documentation

## 2018-02-27 DIAGNOSIS — Z87891 Personal history of nicotine dependence: Secondary | ICD-10-CM | POA: Diagnosis not present

## 2018-02-27 DIAGNOSIS — Z79899 Other long term (current) drug therapy: Secondary | ICD-10-CM | POA: Insufficient documentation

## 2018-02-27 DIAGNOSIS — R1032 Left lower quadrant pain: Secondary | ICD-10-CM | POA: Diagnosis present

## 2018-02-27 LAB — COMPREHENSIVE METABOLIC PANEL
ALT: 23 U/L (ref 17–63)
AST: 29 U/L (ref 15–41)
Albumin: 4.5 g/dL (ref 3.5–5.0)
Alkaline Phosphatase: 92 U/L (ref 38–126)
Anion gap: 10 (ref 5–15)
BUN: 46 mg/dL — ABNORMAL HIGH (ref 6–20)
CHLORIDE: 107 mmol/L (ref 101–111)
CO2: 17 mmol/L — AB (ref 22–32)
Calcium: 10.2 mg/dL (ref 8.9–10.3)
Creatinine, Ser: 2.07 mg/dL — ABNORMAL HIGH (ref 0.61–1.24)
GFR calc Af Amer: 35 mL/min — ABNORMAL LOW (ref >60)
GFR calc non Af Amer: 31 mL/min — ABNORMAL LOW (ref >60)
Glucose, Bld: 76 mg/dL (ref 65–99)
POTASSIUM: 6.1 mmol/L — AB (ref 3.5–5.1)
Sodium: 134 mmol/L — ABNORMAL LOW (ref 135–145)
Total Bilirubin: 0.5 mg/dL (ref 0.3–1.2)
Total Protein: 9.2 g/dL — ABNORMAL HIGH (ref 6.5–8.1)

## 2018-02-27 LAB — LIPASE, BLOOD: LIPASE: 66 U/L — AB (ref 11–51)

## 2018-02-27 LAB — CBC
HCT: 45.6 % (ref 39.0–52.0)
HEMOGLOBIN: 15.1 g/dL (ref 13.0–17.0)
MCH: 25.6 pg — ABNORMAL LOW (ref 26.0–34.0)
MCHC: 33.1 g/dL (ref 30.0–36.0)
MCV: 77.3 fL — ABNORMAL LOW (ref 78.0–100.0)
Platelets: 128 10*3/uL — ABNORMAL LOW (ref 150–400)
RBC: 5.9 MIL/uL — AB (ref 4.22–5.81)
RDW: 17.3 % — ABNORMAL HIGH (ref 11.5–15.5)
WBC: 7.5 10*3/uL (ref 4.0–10.5)

## 2018-02-27 LAB — URINALYSIS, ROUTINE W REFLEX MICROSCOPIC
BILIRUBIN URINE: NEGATIVE
Bacteria, UA: NONE SEEN
Glucose, UA: NEGATIVE mg/dL
HGB URINE DIPSTICK: NEGATIVE
Ketones, ur: 5 mg/dL — AB
LEUKOCYTES UA: NEGATIVE
Nitrite: NEGATIVE
Protein, ur: 100 mg/dL — AB
RBC / HPF: NONE SEEN RBC/hpf (ref 0–5)
Specific Gravity, Urine: 1.015 (ref 1.005–1.030)
pH: 5 (ref 5.0–8.0)

## 2018-02-27 LAB — I-STAT CHEM 8, ED
BUN: 41 mg/dL — AB (ref 6–20)
CHLORIDE: 109 mmol/L (ref 101–111)
CREATININE: 1.8 mg/dL — AB (ref 0.61–1.24)
Calcium, Ion: 1.24 mmol/L (ref 1.15–1.40)
GLUCOSE: 57 mg/dL — AB (ref 65–99)
HCT: 45 % (ref 39.0–52.0)
Hemoglobin: 15.3 g/dL (ref 13.0–17.0)
POTASSIUM: 5.8 mmol/L — AB (ref 3.5–5.1)
Sodium: 136 mmol/L (ref 135–145)
TCO2: 18 mmol/L — ABNORMAL LOW (ref 22–32)

## 2018-02-27 MED ORDER — DICYCLOMINE HCL 20 MG PO TABS: 20.0000 mg | ORAL_TABLET | Freq: Two times a day (BID) | ORAL | 0 refills | Status: DC

## 2018-02-27 MED ORDER — SODIUM POLYSTYRENE SULFONATE 15 GM/60ML PO SUSP
15.0000 g | Freq: Once | ORAL | Status: AC
  Administered 2018-02-27: 15 g via ORAL
  Filled 2018-02-27: qty 60

## 2018-02-27 MED ORDER — SODIUM CHLORIDE 0.9 % IV BOLUS
1000.0000 mL | Freq: Once | INTRAVENOUS | Status: AC
  Administered 2018-02-27: 1000 mL via INTRAVENOUS

## 2018-02-27 MED ORDER — ONDANSETRON 4 MG PO TBDP: 4.0000 mg | ORAL_TABLET | Freq: Three times a day (TID) | ORAL | 0 refills | Status: DC | PRN

## 2018-02-27 NOTE — Discharge Instructions (Addendum)
You will need your kidney function rechecked by your pcp in 1-2 weeks. Hold potassium for 1 week.

## 2018-02-27 NOTE — ED Notes (Signed)
Urine culture sent.

## 2018-02-27 NOTE — ED Triage Notes (Signed)
Pt is c/o lower abdominal pain that started 2 days ago. Pt reports 1 episode of diarrhea denies blood in stool this am. Pt denies pain burning with urination and no n/v. Pt has hyperactive bowel sounds abdomen is soft to touch.

## 2018-02-27 NOTE — ED Provider Notes (Signed)
[place] DEPT Provider Note   CSN: [id number] Arrival date & time: 02/27/18  1204     History   Chief Complaint Chief Complaint  Patient presents with  . Abdominal Pain    HPI Clinton Thomas is a 72 y.o. male.  Pt presents to the ED today with LLQ pain that started 2 days ago.  1 episode diarrhea this morning.  No n/v.  Pt has also had a cough.  The pt has not had a fever.  Yesterday, he spent the day in bed due to the pain.     Past Medical History:  Diagnosis Date  . Atrial flutter ([place])   . Colon polyp   . Gout   . Hepatitis C   . Hx of echocardiogram    Echo 9/13:  mod to severe LVH, EF 70%, no MV SAM, no LVOT gradient, slight intracavitary LV gradient, mod BAE, mild TR, PASP 33  . Hypertension   . Microcytic anemia    colo at [facility] 2012 "ok" per patient  . Peripheral neuropathy   . Thrombocytopenia ([place])    evaluated by Heme in past     Patient Active Problem List   Diagnosis Date Noted  . Paroxysmal atrial flutter ([place]) 09/13/2013  . On continuous oral anticoagulation, beginning today for possible ablation 09/13/2013  . Hx of colonic polyps 08/08/2012  . Hx of hepatitis C 08/08/2012  . Peripheral neuropathy 08/08/2012  . Anemia 08/08/2012  . Hx of thrombocytopenia 08/08/2012  . Atrial flutter ([place]) 07/30/2012  . GOUT 10/12/2010  . HYPERTENSION 10/12/2010    Past Surgical History:  Procedure Laterality Date  . BACK SURGERY    . CERVICAL DISC SURGERY    . REVISION TOTAL KNEE ARTHROPLASTY     Both        Home Medications    Prior to Admission medications   Medication Sig Start Date End Date Taking? Authorizing Provider  allopurinol (ZYLOPRIM) 300 MG tablet Take 300 mg by mouth daily.   Yes [provider]  atorvastatin (LIPITOR) 40 MG tablet Take 40 mg by mouth at bedtime.   Yes [provider]  diltiazem (TIAZAC) 360 MG 24 hr capsule Take 360 mg by mouth daily.   Yes [provider]  furosemide (LASIX) 40 MG tablet Take 20 mg by mouth daily.   Yes [provider]  lisinopril (PRINIVIL,ZESTRIL) 40 MG tablet Take 20 mg by mouth daily.    Yes [provider]  methocarbamol (ROBAXIN) 500 MG tablet Take 500 mg by mouth 3 (three) times daily as needed for muscle spasms.   Yes [provider]  potassium chloride SA (K-DUR,KLOR-CON) 20 MEQ tablet Take 20 mEq by mouth 2 (two) times daily.   Yes [provider]  sertraline (ZOLOFT) 100 MG tablet Take 200 mg by mouth daily.    Yes [provider]  sodium chloride (OCEAN) 0.65 % nasal spray Place 2 sprays into the nose 2 (two) times daily as needed for congestion.   Yes [provider]  traZODone (DESYREL) 100 MG tablet Take 100 mg by mouth at bedtime as needed for sleep.   Yes [provider]  dicyclomine (BENTYL) 20 MG tablet Take 1 tablet (20 mg total) by mouth 2 (two) times daily. 02/27/18   [name], MD  ondansetron (ZOFRAN ODT) 4 MG disintegrating tablet Take 1 tablet (4 mg total) by mouth every 8 (eight) hours as needed. 02/27/18   [name],  MD  Rivaroxaban (XARELTO) 20 MG TABS tablet Take 1 tablet (20 mg total) by mouth daily. Patient not taking: Reported on 02/27/2018 09/13/13   [name], NP    Family History Family History  Problem Relation Age of Onset  . Stroke Mother   . COPD Father   . Arthritis Sister     Social History Social History   Tobacco Use  . Smoking status: Former Research scientist (life sciences)  . Smokeless tobacco: Never Used  Substance Use Topics  . Alcohol use: Yes    Comment: Occasional  . Drug use: Not on file     Allergies   Linezolid   Review of Systems Review of Systems  Gastrointestinal: Positive for abdominal pain.  All other systems reviewed and are negative.    Physical Exam Updated Vital Signs BP (!) 155/90   Pulse 92   Temp 97.6 F (36.4 C) (Oral)   Resp 18   Ht 6\' 1"  (1.854 m)   Wt 83.9 kg (185  lb)   SpO2 100%   BMI 24.41 kg/m   Physical Exam  Constitutional: He is oriented to person, place, and time. He appears well-developed and well-nourished.  HENT:  Head: Normocephalic and atraumatic.  Mouth/Throat: Oropharynx is clear and moist.  Eyes: Pupils are equal, round, and reactive to light. EOM are normal.  Cardiovascular: Normal rate, regular rhythm, normal heart sounds and intact distal pulses.  Pulmonary/Chest: Effort normal and breath sounds normal.  Abdominal: Normal appearance and bowel sounds are normal. There is tenderness in the left lower quadrant.  Neurological: He is alert and oriented to person, place, and time.  Skin: Skin is warm. Capillary refill takes less than 2 seconds.  Psychiatric: He has a normal mood and affect. His behavior is normal.  Nursing note and vitals reviewed.    ED Treatments / Results  Labs (all labs ordered are listed, but only abnormal results are displayed) Labs Reviewed  LIPASE, BLOOD - Abnormal; Notable for the following components:      Result Value   Lipase 66 (*)    All other components within normal limits  COMPREHENSIVE METABOLIC PANEL - Abnormal; Notable for the following components:   Sodium 134 (*)    Potassium 6.1 (*)    CO2 17 (*)    BUN 46 (*)    Creatinine, Ser 2.07 (*)    Total Protein 9.2 (*)    GFR calc non Af Amer 31 (*)    GFR calc Af Amer 35 (*)    All other components within normal limits  CBC - Abnormal; Notable for the following components:   RBC 5.90 (*)    MCV 77.3 (*)    MCH 25.6 (*)    RDW 17.3 (*)    Platelets 128 (*)    All other components within normal limits  URINALYSIS, ROUTINE W REFLEX MICROSCOPIC - Abnormal; Notable for the following components:   Ketones, ur 5 (*)    Protein, ur 100 (*)    Squamous Epithelial / LPF 0-5 (*)    All other components within normal limits  I-STAT CHEM 8, ED - Abnormal; Notable for the following components:   Potassium 5.8 (*)    BUN 41 (*)    Creatinine,  Ser 1.80 (*)    Glucose, Bld 57 (*)    TCO2 18 (*)    All other components within normal limits    EKG None  Radiology Ct Abdomen Pelvis Wo Contrast  Result Date: 02/27/2018 CLINICAL  DATA:  72 year old male with 2 days of lower abdominal pain and 1 episode of diarrhea. EXAM: CT ABDOMEN AND PELVIS WITHOUT CONTRAST TECHNIQUE: Multidetector CT imaging of the abdomen and pelvis was performed following the standard protocol without IV contrast. COMPARISON:  Abdomen ultrasound 07/08/2015 and earlier. FINDINGS: Lower chest: Mild scarring in the medial basal segment of the right lower lobe. Otherwise negative lung bases. No pericardial or pleural effusion. Hepatobiliary: Negative noncontrast liver and gallbladder. Pancreas: Negative. Spleen: Negative. Adrenals/Urinary Tract: Normal adrenal glands. Renal artery calcified atherosclerosis. No urologic calculus, hydronephrosis, or perinephric stranding. The right ureter is negative throughout its course. The left ureter appears mildly larger than the right, but there is no periureteral stranding or ureteral calculus identified. Urinary bladder is mildly distended but otherwise unremarkable. No perivesical stranding. Stomach/Bowel: Mildly gas distended rectum. Mildly redundant sigmoid colon is otherwise negative. There is moderate diverticulosis in the distal descending colon continuing to the junction with the sigmoid, but no active inflammation. The proximal descending colon is spared and appears normal. There is air in fluid in the transverse colon which is not abnormally dilated. Similar appearance of the right colon which is redundant. The cecum is located on a lax mesentery in the right upper quadrant. The appendix is normal (series 2, image 40). The terminal ileum is within normal limits. No dilated small bowel. The stomach and duodenum are also within normal limits. No abdominal free air or free fluid. No mesenteric stranding identified. Vascular/Lymphatic:  Extensive Aortoiliac calcified atherosclerosis. Vascular patency is not evaluated in the absence of IV contrast. No lymphadenopathy. Reproductive: Negative. Other: No pelvic free fluid. Musculoskeletal: Advanced lower thoracic and upper lumbar spine degeneration. Extensive previous mid and lower lumbar fusion. Postoperative changes to the medial right iliac bone. No acute osseous abnormality identified. IMPRESSION: 1. No acute or inflammatory process identified in the noncontrast abdomen or pelvis. 2. Normal appendix. Diverticulosis in the distal descending colon without active inflammation. 3.  Aortic Atherosclerosis (ICD10-I70.0). Electronically Signed   By: Genevie Ann M.D.   On: 02/27/2018 18:49   Dg Chest 2 View  Result Date: 02/27/2018 CLINICAL DATA:  Lower abdominal pain starting 2 days ago. EXAM: CHEST - 2 VIEW COMPARISON:  09/10/2013 FINDINGS: The heart size and mediastinal contours are within normal limits. Both lungs are clear. The visualized skeletal structures are unremarkable. IMPRESSION: No active cardiopulmonary disease. Electronically Signed   By: Ashley Royalty M.D.   On: 02/27/2018 18:10    Procedures Procedures (including critical care time)  Medications Ordered in ED Medications  sodium polystyrene (KAYEXALATE) 15 GM/60ML suspension 15 g (has no administration in time range)  sodium chloride 0.9 % bolus 1,000 mL (1,000 mLs Intravenous New Bag/Given 02/27/18 1825)     Initial Impression / Assessment and Plan / ED Course  I have reviewed the triage vital signs and the nursing notes.  Pertinent labs & imaging results that were available during my care of the patient were reviewed by me and considered in my medical decision making (see chart for details).   Pt's potassium has come down a little.  He will be given 1 dose of kayexalate prior to d/c.  He is told to hold potassium for 1 week.  His pcp is at the [place] and he is to f/u for bmp recheck in 1 week.  Pt is feeling much better and is  stable for d/c.  Return if worse.  Final Clinical Impressions(s) / ED Diagnoses   Final diagnoses:  Generalized abdominal pain  CKD (chronic kidney disease) stage 3, GFR 30-59 ml/min [facility])    ED Discharge Orders        Ordered    dicyclomine (BENTYL) 20 MG tablet  2 times daily     02/27/18 1901    ondansetron (ZOFRAN ODT) 4 MG disintegrating tablet  Every 8 hours PRN     02/27/18 1901       [name], MD 02/27/18 1943

## 2019-02-27 ENCOUNTER — Emergency Department (HOSPITAL_COMMUNITY): Payer: Medicare HMO

## 2019-02-27 ENCOUNTER — Other Ambulatory Visit: Payer: Self-pay

## 2019-02-27 ENCOUNTER — Observation Stay (HOSPITAL_COMMUNITY)
Admission: EM | Admit: 2019-02-27 | Discharge: 2019-03-01 | Disposition: A | Discharge status: Home / Self Care | Payer: Medicare HMO | Attending: Internal Medicine | Admitting: Internal Medicine

## 2019-02-27 ENCOUNTER — Encounter (HOSPITAL_COMMUNITY): Payer: Self-pay

## 2019-02-27 DIAGNOSIS — Z79899 Other long term (current) drug therapy: Secondary | ICD-10-CM | POA: Insufficient documentation

## 2019-02-27 DIAGNOSIS — N183 Chronic kidney disease, stage 3 unspecified: Secondary | ICD-10-CM

## 2019-02-27 DIAGNOSIS — Z791 Long term (current) use of non-steroidal anti-inflammatories (NSAID): Secondary | ICD-10-CM | POA: Insufficient documentation

## 2019-02-27 DIAGNOSIS — D696 Thrombocytopenia, unspecified: Secondary | ICD-10-CM | POA: Diagnosis not present

## 2019-02-27 DIAGNOSIS — Z888 Allergy status to other drugs, medicaments and biological substances status: Secondary | ICD-10-CM | POA: Insufficient documentation

## 2019-02-27 DIAGNOSIS — M109 Gout, unspecified: Secondary | ICD-10-CM | POA: Diagnosis not present

## 2019-02-27 DIAGNOSIS — B1920 Unspecified viral hepatitis C without hepatic coma: Secondary | ICD-10-CM | POA: Insufficient documentation

## 2019-02-27 DIAGNOSIS — Z96653 Presence of artificial knee joint, bilateral: Secondary | ICD-10-CM | POA: Insufficient documentation

## 2019-02-27 DIAGNOSIS — R932 Abnormal findings on diagnostic imaging of liver and biliary tract: Secondary | ICD-10-CM

## 2019-02-27 DIAGNOSIS — F1290 Cannabis use, unspecified, uncomplicated: Secondary | ICD-10-CM | POA: Diagnosis not present

## 2019-02-27 DIAGNOSIS — G629 Polyneuropathy, unspecified: Secondary | ICD-10-CM | POA: Insufficient documentation

## 2019-02-27 DIAGNOSIS — Z7982 Long term (current) use of aspirin: Secondary | ICD-10-CM | POA: Insufficient documentation

## 2019-02-27 DIAGNOSIS — I4892 Unspecified atrial flutter: Secondary | ICD-10-CM | POA: Diagnosis not present

## 2019-02-27 DIAGNOSIS — R195 Other fecal abnormalities: Secondary | ICD-10-CM | POA: Diagnosis not present

## 2019-02-27 DIAGNOSIS — I129 Hypertensive chronic kidney disease with stage 1 through stage 4 chronic kidney disease, or unspecified chronic kidney disease: Secondary | ICD-10-CM | POA: Insufficient documentation

## 2019-02-27 DIAGNOSIS — I6521 Occlusion and stenosis of right carotid artery: Secondary | ICD-10-CM

## 2019-02-27 DIAGNOSIS — Z862 Personal history of diseases of the blood and blood-forming organs and certain disorders involving the immune mechanism: Secondary | ICD-10-CM

## 2019-02-27 DIAGNOSIS — Z7901 Long term (current) use of anticoagulants: Secondary | ICD-10-CM | POA: Diagnosis not present

## 2019-02-27 DIAGNOSIS — K7460 Unspecified cirrhosis of liver: Secondary | ICD-10-CM

## 2019-02-27 DIAGNOSIS — Z86010 Personal history of colonic polyps: Secondary | ICD-10-CM | POA: Diagnosis not present

## 2019-02-27 DIAGNOSIS — R55 Syncope and collapse: Secondary | ICD-10-CM | POA: Diagnosis present

## 2019-02-27 DIAGNOSIS — D631 Anemia in chronic kidney disease: Secondary | ICD-10-CM | POA: Diagnosis not present

## 2019-02-27 DIAGNOSIS — D649 Anemia, unspecified: Secondary | ICD-10-CM

## 2019-02-27 DIAGNOSIS — Z87891 Personal history of nicotine dependence: Secondary | ICD-10-CM | POA: Diagnosis not present

## 2019-02-27 LAB — CBC WITH DIFFERENTIAL/PLATELET
Abs Immature Granulocytes: 0.01 10*3/uL (ref 0.00–0.07)
Basophils Absolute: 0 10*3/uL (ref 0.0–0.1)
Basophils Relative: 1 %
Eosinophils Absolute: 0.2 10*3/uL (ref 0.0–0.5)
Eosinophils Relative: 4 %
HCT: 32 % — ABNORMAL LOW (ref 39.0–52.0)
Hemoglobin: 9.8 g/dL — ABNORMAL LOW (ref 13.0–17.0)
Immature Granulocytes: 0 %
Lymphocytes Relative: 22 %
Lymphs Abs: 1.2 10*3/uL (ref 0.7–4.0)
MCH: 25.9 pg — ABNORMAL LOW (ref 26.0–34.0)
MCHC: 30.6 g/dL (ref 30.0–36.0)
MCV: 84.7 fL (ref 80.0–100.0)
Monocytes Absolute: 0.5 10*3/uL (ref 0.1–1.0)
Monocytes Relative: 9 %
Neutro Abs: 3.5 10*3/uL (ref 1.7–7.7)
Neutrophils Relative %: 64 %
Platelets: 110 10*3/uL — ABNORMAL LOW (ref 150–400)
RBC: 3.78 MIL/uL — ABNORMAL LOW (ref 4.22–5.81)
RDW: 15.9 % — ABNORMAL HIGH (ref 11.5–15.5)
WBC: 5.4 10*3/uL (ref 4.0–10.5)
nRBC: 0 % (ref 0.0–0.2)

## 2019-02-27 LAB — RAPID URINE DRUG SCREEN, HOSP PERFORMED
Amphetamines: NOT DETECTED
Barbiturates: NOT DETECTED
Benzodiazepines: NOT DETECTED
Cocaine: NOT DETECTED
Opiates: NOT DETECTED
Tetrahydrocannabinol: POSITIVE — AB

## 2019-02-27 LAB — URINALYSIS, ROUTINE W REFLEX MICROSCOPIC
Bilirubin Urine: NEGATIVE
Glucose, UA: NEGATIVE mg/dL
Hgb urine dipstick: NEGATIVE
Ketones, ur: NEGATIVE mg/dL
Nitrite: NEGATIVE
Protein, ur: NEGATIVE mg/dL
Specific Gravity, Urine: 1.012 (ref 1.005–1.030)
pH: 5 (ref 5.0–8.0)

## 2019-02-27 LAB — HEPATIC FUNCTION PANEL
ALT: 22 U/L (ref 0–44)
AST: 26 U/L (ref 15–41)
Albumin: 3.9 g/dL (ref 3.5–5.0)
Alkaline Phosphatase: 64 U/L (ref 38–126)
Bilirubin, Direct: <0.1 mg/dL (ref 0.0–0.2)
Total Bilirubin: 0.3 mg/dL (ref 0.3–1.2)
Total Protein: 7.2 g/dL (ref 6.5–8.1)

## 2019-02-27 LAB — TROPONIN I: Troponin I: <0.03 ng/mL (ref <0.03)

## 2019-02-27 LAB — BASIC METABOLIC PANEL
Anion gap: 10 (ref 5–15)
BUN: 34 mg/dL — ABNORMAL HIGH (ref 8–23)
CO2: 20 mmol/L — ABNORMAL LOW (ref 22–32)
Calcium: 9.3 mg/dL (ref 8.9–10.3)
Chloride: 107 mmol/L (ref 98–111)
Creatinine, Ser: 2.17 mg/dL — ABNORMAL HIGH (ref 0.61–1.24)
GFR calc Af Amer: 34 mL/min — ABNORMAL LOW (ref >60)
GFR calc non Af Amer: 29 mL/min — ABNORMAL LOW (ref >60)
Glucose, Bld: 112 mg/dL — ABNORMAL HIGH (ref 70–99)
Potassium: 4.5 mmol/L (ref 3.5–5.1)
Sodium: 137 mmol/L (ref 135–145)

## 2019-02-27 LAB — CBG MONITORING, ED: Glucose-Capillary: 106 mg/dL — ABNORMAL HIGH (ref 70–99)

## 2019-02-27 LAB — ETHANOL: Alcohol, Ethyl (B): <10 mg/dL (ref <10)

## 2019-02-27 LAB — MAGNESIUM: Magnesium: 2.1 mg/dL (ref 1.7–2.4)

## 2019-02-27 LAB — POC OCCULT BLOOD, ED: Fecal Occult Bld: POSITIVE — AB

## 2019-02-27 MED ORDER — LACTATED RINGERS IV BOLUS
1000.0000 mL | Freq: Once | INTRAVENOUS | Status: AC
  Administered 2019-02-27: 1000 mL via INTRAVENOUS

## 2019-02-27 MED ORDER — SODIUM CHLORIDE 0.9 % IV SOLN
INTRAVENOUS | Status: AC
  Administered 2019-02-27: via INTRAVENOUS

## 2019-02-27 MED ORDER — SODIUM CHLORIDE 0.9% FLUSH
3.0000 mL | Freq: Two times a day (BID) | INTRAVENOUS | Status: DC
  Administered 2019-02-27 – 2019-03-01 (×4): 3 mL via INTRAVENOUS

## 2019-02-27 MED ORDER — ACETAMINOPHEN 325 MG PO TABS: 650.0000 mg | ORAL_TABLET | Freq: Four times a day (QID) | ORAL | Status: DC | PRN

## 2019-02-27 MED ORDER — ACETAMINOPHEN 650 MG RE SUPP: 650.0000 mg | Freq: Four times a day (QID) | RECTAL | Status: DC | PRN

## 2019-02-27 NOTE — H&P (Addendum)
History and Physical    Clinton Thomas HWT:[id number] DOB: 05-Mar-1946 DOA: 02/27/2019  PCP: System, Provider Not In Patient coming from: Home  Chief Complaint: Syncope  HPI: Clinton Thomas is a 73 y.o. male with medical history significant of atrial flutter, hypertension, hepatitis C, gout, colon polyp presenting to the hospital via EMS for evaluation of loss of consciousness.  Per EMS report, family found the patient slumped over in a chair, unresponsive, blue in the face, and not breathing well.  Patient was found to be possibly hypotensive with EMS and was given 250 cc fluid bolus.  Hemodynamically stable upon arrival. Patient states he was in his usual state of health and sweeping his porch today and then just remembers waking up on the ground with his family around him.  Denies any dizziness/lightheadedness, chest pain, or shortness of breath prior to the syncopal episode.  Denies any recent illness, fevers, chills, chest pain, shortness of breath, rhinorrhea, sore throat, abdominal pain, nausea, vomiting, diarrhea, dysuria, or urinary frequency/urgency.  Reports smoking marijuana chronically but denies any other drug use.  Denies alcohol use.  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  He is also a former smoker.  No change in his chronic cough and it has not been bothering him recently.  Denies any recent travel, sick contacts, or exposure to any individual with confirmed COVID-19.  Reports having dark colored stools sometimes.  No hematochezia.  Denies any history of seizures.  States his PCP is Dr. Hassell Done at [facility]. States he had ablation done for his atrial flutter at the [place] within the past year.   Review of Systems: As per HPI otherwise 10 point review of systems negative.  Past Medical History:  Diagnosis Date  . Atrial flutter ([place])   . Colon polyp   . Gout   . Hepatitis C   . Hx of echocardiogram    Echo 9/13:  mod to severe LVH, EF 70%, no  MV SAM, no LVOT gradient, slight intracavitary LV gradient, mod BAE, mild TR, PASP 33  . Hypertension   . Microcytic anemia    colo at [facility] 2012 "ok" per patient  . Peripheral neuropathy   . Thrombocytopenia ([place])    evaluated by Heme in past     Past Surgical History:  Procedure Laterality Date  . BACK SURGERY    . CERVICAL DISC SURGERY    . REVISION TOTAL KNEE ARTHROPLASTY     Both     reports that he has quit smoking. He has never used smokeless tobacco. He reports current alcohol use. He reports that he does not use drugs.  Allergies  Allergen Reactions  . Linezolid Other (See Comments)    unknown    Family History  Problem Relation Age of Onset  . Stroke Mother   . COPD Father   . Arthritis Sister     Prior to Admission medications   Medication Sig Start Date End Date Taking? Authorizing Provider  allopurinol (ZYLOPRIM) 300 MG tablet Take 300 mg by mouth daily.    [provider]  atorvastatin (LIPITOR) 40 MG tablet Take 40 mg by mouth at bedtime.    [provider]  dicyclomine (BENTYL) 20 MG tablet Take 1 tablet (20 mg total) by mouth 2 (two) times daily. 02/27/18   [name], MD  diltiazem (TIAZAC) 360 MG 24 hr capsule Take 360 mg by mouth daily.    [provider]  furosemide (LASIX) 40 MG tablet Take 20 mg by mouth daily.    [provider]  lisinopril (PRINIVIL,ZESTRIL) 40 MG tablet Take 20 mg by mouth daily.     [provider]  methocarbamol (ROBAXIN) 500 MG tablet Take 500 mg by mouth 3 (three) times daily as needed for muscle spasms.    [provider]  ondansetron (ZOFRAN ODT) 4 MG disintegrating tablet Take 1 tablet (4 mg total) by mouth every 8 (eight) hours as needed. 02/27/18   [name], MD  potassium chloride SA (K-DUR,KLOR-CON) 20 MEQ tablet Take 20 mEq by mouth 2 (two) times daily.    [provider]  Rivaroxaban (XARELTO) 20 MG TABS tablet Take 1 tablet (20 mg total) by  mouth daily. Patient not taking: Reported on 02/27/2018 09/13/13   [name], NP  sertraline (ZOLOFT) 100 MG tablet Take 200 mg by mouth daily.     [provider]  sodium chloride (OCEAN) 0.65 % nasal spray Place 2 sprays into the nose 2 (two) times daily as needed for congestion.    [provider]  traZODone (DESYREL) 100 MG tablet Take 100 mg by mouth at bedtime as needed for sleep.    [provider]    Physical Exam: Vitals:   02/27/19 2055 02/27/19 2100 02/27/19 2115 02/27/19 2308  BP: 111/60 113/71 139/80 (!) 156/94  Pulse: 60 70 65 67  Resp: 17 16 12 16   Temp:    98.1 F (36.7 C)  TempSrc:    Oral  SpO2: 97% 100% 100% 99%  Weight:    94.1 kg  Height:    6\' 1"  (1.854 m)    Physical Exam  Constitutional: He is oriented to person, place, and time. He appears well-developed and well-nourished. No distress.  HENT:  Head: Normocephalic.  Eyes: Right eye exhibits no discharge. Left eye exhibits no discharge.  Neck: Neck supple.  Cardiovascular: Normal rate, regular rhythm and intact distal pulses.  Pulmonary/Chest: Effort normal and breath sounds normal. No respiratory distress. He has no wheezes. He has no rales.  Abdominal: Soft. Bowel sounds are normal. He exhibits no distension. There is no abdominal tenderness. There is no rebound and no guarding.  Musculoskeletal:        General: No edema.  Neurological: He is alert and oriented to person, place, and time.  Skin: Skin is warm and dry. He is not diaphoretic.     Labs on Admission: I have personally reviewed following labs and imaging studies  CBC: Recent Labs  Lab 02/27/19 1944  WBC 5.4  NEUTROABS 3.5  HGB 9.8*  HCT 32.0*  MCV 84.7  PLT 128*   Basic Metabolic Panel: Recent Labs  Lab 02/27/19 1944  NA 137  K 4.5  CL 107  CO2 20*  GLUCOSE 112*  BUN 34*  CREATININE 2.17*  CALCIUM 9.3  MG 2.1   GFR: Estimated Creatinine Clearance: 34.8 mL/min (A) (by C-G formula based  on SCr of 2.17 mg/dL (H)). Liver Function Tests: Recent Labs  Lab 02/27/19 1944  AST 26  ALT 22  ALKPHOS 64  BILITOT 0.3  PROT 7.2  ALBUMIN 3.9   No results for input(s): LIPASE, AMYLASE in the last 168 hours. No results for input(s): AMMONIA in the last 168 hours. Coagulation Profile: No results for input(s): INR, PROTIME in the last 168 hours. Cardiac Enzymes: Recent Labs  Lab 02/27/19 1944  TROPONINI <0.03   BNP (last 3 results) No results for input(s): PROBNP  in the last 8760 hours. HbA1C: No results for input(s): HGBA1C in the last 72 hours. CBG: Recent Labs  Lab 02/27/19 2001  GLUCAP 106*   Lipid Profile: No results for input(s): CHOL, HDL, LDLCALC, TRIG, CHOLHDL, LDLDIRECT in the last 72 hours. Thyroid Function Tests: No results for input(s): TSH, T4TOTAL, FREET4, T3FREE, THYROIDAB in the last 72 hours. Anemia Panel: No results for input(s): VITAMINB12, FOLATE, FERRITIN, TIBC, IRON, RETICCTPCT in the last 72 hours. Urine analysis:    Component Value Date/Time   COLORURINE YELLOW 02/27/2019 2100   APPEARANCEUR CLEAR 02/27/2019 2100   LABSPEC 1.012 02/27/2019 2100   PHURINE 5.0 02/27/2019 2100   GLUCOSEU NEGATIVE 02/27/2019 2100   HGBUR NEGATIVE 02/27/2019 2100   [place] NEGATIVE 02/27/2019 2100   [place] NEGATIVE 02/27/2019 2100   PROTEINUR NEGATIVE 02/27/2019 2100   UROBILINOGEN 1.0 09/10/2013 2140   NITRITE NEGATIVE 02/27/2019 2100   LEUKOCYTESUR TRACE (A) 02/27/2019 2100    Radiological Exams on Admission: Ct Head Wo Contrast  Result Date: 02/27/2019 CLINICAL DATA:  Syncope. EXAM: CT HEAD WITHOUT CONTRAST TECHNIQUE: Contiguous axial images were obtained from the base of the skull through the vertex without intravenous contrast. COMPARISON:  None. FINDINGS: Brain: No intracranial hemorrhage, mass effect, or midline shift. Generalized atrophy may be slightly advanced for age. Mild chronic small vessel ischemia. Remote lacunar infarcts in the  bilateral basal ganglia. No hydrocephalus. The basilar cisterns are patent. No evidence of territorial infarct or acute ischemia. No extra-axial or intracranial fluid collection. Vascular: Atherosclerosis of skullbase vasculature without hyperdense vessel or abnormal calcification. Skull: No fracture or focal lesion. Sinuses/Orbits: Small mucous retention cyst in the left maxillary sinus. No acute findings. Other: None. IMPRESSION: 1. No acute intracranial abnormality. 2. Mild atrophy and chronic small vessel ischemia. 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 Electronically Signed   By: Keith Rake M.D.   On: 02/27/2019 20:56   Dg Chest Port 1 View  Result Date: 02/27/2019 CLINICAL DATA:  Initial evaluation for acute syncope. EXAM: PORTABLE CHEST 1 VIEW COMPARISON:  Prior radiograph from 02/27/2018 FINDINGS: Cardiac and mediastinal silhouettes are stable in size and contour, and remain within normal limits. Lungs normally inflated. Mild streaky left basilar subsegmental atelectasis. No focal infiltrates. No pulmonary edema or pleural effusion. No pneumothorax. No acute osseous finding. IMPRESSION: 1. Mild left basilar subsegmental atelectasis. 2. No other active cardiopulmonary disease. Electronically Signed   By: Jeannine Boga M.D.   On: 02/27/2019 20:28    EKG: Independently reviewed.  Sinus rhythm, paired PVCs.  Assessment/Plan Principal Problem:   Syncope Active Problems:   Atrial flutter ([facility])   Anemia   Hx of thrombocytopenia   CKD (chronic kidney disease) stage 3, GFR 30-59 ml/min ([facility])   Syncope Concern for arrhythmia given history of atrial flutter and no prodrome.  Although continues to be in sinus rhythm since his arrival to the hospital. Troponin negative and EKG not suggestive of ACS. PE less likely as patient is not hypoxic or tachycardic.  Not endorsing any chest pain. Infectious etiology less likely as patient is afebrile and does not have leukocytosis.  Chest x-ray  not suggestive of pneumonia.  UA with trace amount of leukocytes, 6-10 WBCs, rare bacteria, and negative nitrite.  Patient is not endorsing any UTI symptoms. Blood ethanol level negative. Not hypoglycemic.  UDS positive for THC but no other drugs. Head CT negative for acute finding. -Cardiac monitoring -Echocardiogram -IV fluid hydration -Check orthostatics -Carotid Dopplers -Check urine culture -Consider EEG if above  work-up is negative.  Anemia, suspect secondary to occult GI bleed Hemoglobin 9.8, was 15.3 a year ago.  No recent baseline.  Patient reports intermittent dark stools but denies hematochezia.  FOBT checked today positive but no gross melena or hematochezia on rectal exam done in the ED. Hemodynamically stable.  EGD done in 2013 showing 2 very superficial 1 mm clean base erosions in the gastric body and mild erythema of the duodenal bulb.  Biopsy negative for dysplasia or malignancy.  GE junction was very irregular with islands of gastric appearing mucosa within 1 cm of the GE junction.  Biopsy revealed benign hyperplastic squamous island.  Negative for intestinal metaplasia, dysplasia, or malignancy.  Colonoscopy done in 2013 revealed sessile polyp (tubular adenoma per biopsy) measuring 2 to 3 mm at the cecum which was removed.   -Check iron, ferritin, TIBC -Continue to monitor CBC -Consult GI in a.m.  Atrial flutter -Currently in sinus rhythm.  Followed by cardiology at the [facility]. Per patient, he had an ablation done within the past year. -Unclear which medications he is currently taking for rate control and anticoagulation.  Pharmacy medication reconciliation currently pending.  Chronic thrombocytopenia Platelet count 110,000.  -Continue to monitor CBC  CKD stage III -Stable.  Creatinine 2.1, baseline 1.8-2.0 a year ago. -Continue to monitor BMP  Unable to safely order home medications at this time as pharmacy medication reconciliation is pending.  DVT prophylaxis: SCDs  Code Status: Patient wishes to be full code. Family Communication: No family available. Disposition Plan: Anticipate discharge after clinical improvement. Consults called: None Admission status: Observation, telemetry  This chart was dictated using voice recognition software.  Despite best efforts to proofread, errors can occur which can change the documentation meaning.  Shela Leff MD [facility] Pager [phone]  If 7PM-7AM, please contact night-coverage [email or web address] Password [facility]  02/27/2019, 11:27 PM

## 2019-02-27 NOTE — ED Notes (Signed)
ED TO INPATIENT HANDOFF REPORT  ED Nurse Name and Phone #:  Minie Roadcap [phone]  S Name/Age/Gender Clinton Thomas 73 y.o. male Room/Bed: TRAAC/TRAAC  Code Status   Code Status: Not on file  Home/SNF/Other Home Patient oriented to: self, place, time and situation Is this baseline? Yes   Triage Complete: Triage complete  Chief Complaint syncope  Triage Note Was on his back porch with family when they left him unattended for about 15-20 mins, when they came back outside he was unresponsive, blue in color, and agonal breathing. Ems called, family notes he is on 4 BP medications.   Allergies Allergies  Allergen Reactions  . Linezolid Other (See Comments)    unknown    Level of Care/Admitting Diagnosis ED Disposition    ED Disposition Condition [facility] Area: [place] [100100]  Level of Care: Telemetry Medical [104]  I expect the patient will be discharged within 24 hours: Yes  LOW acuity---Tx typically complete <24 hrs---ACUTE conditions typically can be evaluated <24 hours---LABS likely to return to acceptable levels <24 hours---IS near functional baseline---EXPECTED to return to current living arrangement---NOT newly hypoxic: Meets criteria for 5C-Observation unit  Diagnosis: Syncope [206001]  Admitting Physician: Shela Leff [[id number]]  Attending Physician: Shela Leff [[id number]]  PT Class (Do Not Modify): Observation [104]  PT Acc Code (Do Not Modify): Observation [10022]       B Medical/Surgery History Past Medical History:  Diagnosis Date  . Atrial flutter ([place])   . Colon polyp   . Gout   . Hepatitis C   . Hx of echocardiogram    Echo 9/13:  mod to severe LVH, EF 70%, no MV SAM, no LVOT gradient, slight intracavitary LV gradient, mod BAE, mild TR, PASP 33  . Hypertension   . Microcytic anemia    colo at [facility] 2012 "ok" per patient  . Peripheral neuropathy   . Thrombocytopenia ([place])    evaluated by  Heme in past    Past Surgical History:  Procedure Laterality Date  . BACK SURGERY    . CERVICAL DISC SURGERY    . REVISION TOTAL KNEE ARTHROPLASTY     Both     A IV Location/Drains/Wounds Patient Lines/Drains/Airways Status   Active Line/Drains/Airways    Name:   Placement date:   Placement time:   Site:   Days:   Peripheral IV 02/27/18 Left Hand   02/27/18    1824    Hand   365   Peripheral IV 02/27/19 Left Forearm   02/27/19    1940    Forearm   less than 1          Intake/Output Last 24 hours  Intake/Output Summary (Last 24 hours) at 02/27/2019 2225 Last data filed at 02/27/2019 1939 Gross per 24 hour  Intake 250 ml  Output -  Net 250 ml    Labs/Imaging Results for orders placed or performed during the hospital encounter of 02/27/19 (from the past 48 hour(s))  CBC WITH DIFFERENTIAL     Status: Abnormal   Collection Time: 02/27/19  7:44 PM  Result Value Ref Range   WBC 5.4 4.0 - 10.5 K/uL   RBC 3.78 (L) 4.22 - 5.81 MIL/uL   Hemoglobin 9.8 (L) 13.0 - 17.0 g/dL   HCT 32.0 (L) 39.0 - 52.0 %   MCV 84.7 80.0 - 100.0 fL   MCH 25.9 (L) 26.0 - 34.0 pg   MCHC 30.6 30.0 - 36.0 g/dL  RDW 15.9 (H) 11.5 - 15.5 %   Platelets 110 (L) 150 - 400 K/uL    Comment: REPEATED TO VERIFY PLATELET COUNT CONFIRMED BY SMEAR SPECIMEN CHECKED FOR CLOTS Immature Platelet Fraction may be clinically indicated, consider ordering this additional test [id number]    nRBC 0.0 0.0 - 0.2 %   Neutrophils Relative % 64 %   Neutro Abs 3.5 1.7 - 7.7 K/uL   Lymphocytes Relative 22 %   Lymphs Abs 1.2 0.7 - 4.0 K/uL   Monocytes Relative 9 %   Monocytes Absolute 0.5 0.1 - 1.0 K/uL   Eosinophils Relative 4 %   Eosinophils Absolute 0.2 0.0 - 0.5 K/uL   Basophils Relative 1 %   Basophils Absolute 0.0 0.0 - 0.1 K/uL   Immature Granulocytes 0 %   Abs Immature Granulocytes 0.01 0.00 - 0.07 K/uL    Comment: Performed at [facility] [address] [place], [place] [address]  Basic metabolic panel      Status: Abnormal   Collection Time: 02/27/19  7:44 PM  Result Value Ref Range   Sodium 137 135 - 145 mmol/L   Potassium 4.5 3.5 - 5.1 mmol/L   Chloride 107 98 - 111 mmol/L   CO2 20 (L) 22 - 32 mmol/L   Glucose, Bld 112 (H) 70 - 99 mg/dL   BUN 34 (H) 8 - 23 mg/dL   Creatinine, Ser 2.17 (H) 0.61 - 1.24 mg/dL   Calcium 9.3 8.9 - 10.3 mg/dL   GFR calc non Af Amer 29 (L) >60 mL/min   GFR calc Af Amer 34 (L) >60 mL/min   Anion gap 10 5 - 15    Comment: Performed at [facility] [address] [place], [place] [address]  Hepatic function panel     Status: None   Collection Time: 02/27/19  7:44 PM  Result Value Ref Range   Total Protein 7.2 6.5 - 8.1 g/dL   Albumin 3.9 3.5 - 5.0 g/dL   AST 26 15 - 41 U/L   ALT 22 0 - 44 U/L   Alkaline Phosphatase 64 38 - 126 U/L   Total Bilirubin 0.3 0.3 - 1.2 mg/dL   Bilirubin, Direct <0.1 0.0 - 0.2 mg/dL   Indirect Bilirubin NOT CALCULATED 0.3 - 0.9 mg/dL    Comment: Performed at [facility] [address] [place], [place] [address]  Troponin I - ONCE - STAT     Status: None   Collection Time: 02/27/19  7:44 PM  Result Value Ref Range   Troponin I <0.03 <0.03 ng/mL    Comment: Performed at [facility], [place] [address], [place], [place] [address]  Magnesium     Status: None   Collection Time: 02/27/19  7:44 PM  Result Value Ref Range   Magnesium 2.1 1.7 - 2.4 mg/dL    Comment: Performed at [facility], [place] [address] [place], [place] [address]  Ethanol     Status: None   Collection Time: 02/27/19  7:48 PM  Result Value Ref Range   Alcohol, Ethyl (B) <10 <10 mg/dL    Comment: (NOTE) Lowest detectable limit for serum alcohol is 10 mg/dL. For medical purposes only. Performed at [facility], [place] [address], [place],  [address]   CBG monitoring, ED     Status: Abnormal   Collection Time: 02/27/19  8:01 PM  Result Value Ref Range   Glucose-Capillary 106 (H) 70 -  99 mg/dL  POC occult blood, ED  Provider will collect     Status: Abnormal   Collection Time: 02/27/19  8:49 PM  Result Value Ref Range   Fecal Occult Bld POSITIVE (A) NEGATIVE  Urinalysis, Routine w reflex microscopic     Status: Abnormal   Collection Time: 02/27/19  9:00 PM  Result Value Ref Range   Color, Urine YELLOW YELLOW   APPearance CLEAR CLEAR   Specific Gravity, Urine 1.012 1.005 - 1.030   pH 5.0 5.0 - 8.0   Glucose, UA NEGATIVE NEGATIVE mg/dL   Hgb urine dipstick NEGATIVE NEGATIVE   Bilirubin Urine NEGATIVE NEGATIVE   Ketones, ur NEGATIVE NEGATIVE mg/dL   Protein, ur NEGATIVE NEGATIVE mg/dL   Nitrite NEGATIVE NEGATIVE   Leukocytes,Ua TRACE (A) NEGATIVE   RBC / HPF 0-5 0 - 5 RBC/hpf   WBC, UA 6-10 0 - 5 WBC/hpf   Bacteria, UA RARE (A) NONE SEEN   Squamous Epithelial / LPF 0-5 0 - 5   Mucus PRESENT    Hyaline Casts, UA PRESENT     Comment: Performed at [facility], [address], [place], [place] [address]  Rapid urine drug screen (hospital performed)     Status: Abnormal   Collection Time: 02/27/19  9:00 PM  Result Value Ref Range   Opiates NONE DETECTED NONE DETECTED   Cocaine NONE DETECTED NONE DETECTED   Benzodiazepines NONE DETECTED NONE DETECTED   Amphetamines NONE DETECTED NONE DETECTED   Tetrahydrocannabinol POSITIVE (A) NONE DETECTED   Barbiturates NONE DETECTED NONE DETECTED    Comment: (NOTE) DRUG SCREEN FOR MEDICAL PURPOSES ONLY.  IF CONFIRMATION IS NEEDED FOR ANY PURPOSE, NOTIFY LAB WITHIN 5 DAYS. LOWEST DETECTABLE LIMITS FOR URINE DRUG SCREEN Drug Class                     Cutoff (ng/mL) Amphetamine and metabolites    1000 Barbiturate and metabolites    200 Benzodiazepine                 604 Tricyclics and metabolites     300 Opiates and metabolites        300 Cocaine and metabolites        300 THC                            50 Performed at [facility], [place] [address] [place], [place] [address]    Ct Head Wo Contrast  Result Date: 02/27/2019 CLINICAL DATA:   Syncope. EXAM: CT HEAD WITHOUT CONTRAST TECHNIQUE: Contiguous axial images were obtained from the base of the skull through the vertex without intravenous contrast. COMPARISON:  None. FINDINGS: Brain: No intracranial hemorrhage, mass effect, or midline shift. Generalized atrophy may be slightly advanced for age. Mild chronic small vessel ischemia. Remote lacunar infarcts in the bilateral basal ganglia. No hydrocephalus. The basilar cisterns are patent. No evidence of territorial infarct or acute ischemia. No extra-axial or intracranial fluid collection. Vascular: Atherosclerosis of skullbase vasculature without hyperdense vessel or abnormal calcification. Skull: No fracture or focal lesion. Sinuses/Orbits: Small mucous retention cyst in the left maxillary sinus. No acute findings. Other: None. IMPRESSION: 1. No acute intracranial abnormality. 2. Mild atrophy and chronic small vessel ischemia. 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 Electronically Signed   By: Keith Rake M.D.   On: 02/27/2019 20:56   Dg Chest Port 1 View  Result Date: 02/27/2019  CLINICAL DATA:  Initial evaluation for acute syncope. EXAM: PORTABLE CHEST 1 VIEW COMPARISON:  Prior radiograph from 02/27/2018 FINDINGS: Cardiac and mediastinal silhouettes are stable in size and contour, and remain within normal limits. Lungs normally inflated. Mild streaky left basilar subsegmental atelectasis. No focal infiltrates. No pulmonary edema or pleural effusion. No pneumothorax. No acute osseous finding. IMPRESSION: 1. Mild left basilar subsegmental atelectasis. 2. No other active cardiopulmonary disease. Electronically Signed   By: Jeannine Boga M.D.   On: 02/27/2019 20:28    Pending Labs Unresulted Labs (From admission, onward)   None      Vitals/Pain Today's Vitals   02/27/19 2045 02/27/19 2055 02/27/19 2100 02/27/19 2115  BP: 122/83 111/60 113/71 139/80  Pulse: 62 60 70 65  Resp: 12 17 16 12   Temp:      TempSrc:       SpO2: 100% 97% 100% 100%    Isolation Precautions No active isolations  Medications Medications  lactated ringers bolus 1,000 mL (1,000 mLs Intravenous New Bag/Given 02/27/19 1954)    Mobility walks Low fall risk   Focused Assessments Cardiac Assessment Handoff:  Cardiac Rhythm: Normal sinus rhythm Lab Results  Component Value Date   TROPONINI <0.03 02/27/2019   Lab Results  Component Value Date   DDIMER (H) 11/09/2010    5.18        AT THE INHOUSE ESTABLISHED CUTOFF VALUE OF 0.48 ug/mL FEU, THIS ASSAY HAS BEEN DOCUMENTED IN THE LITERATURE TO HAVE A SENSITIVITY AND NEGATIVE PREDICTIVE VALUE OF AT LEAST 98 TO 99%.  THE TEST RESULT SHOULD BE CORRELATED WITH AN ASSESSMENT OF THE CLINICAL PROBABILITY OF DVT / VTE.   Does the Patient currently have chest pain? No     R Recommendations: See Admitting Provider Note  Report given to:   Additional Notes:   Valiant Dills (Wife) [phone].. In NAD currently, alert oriented x4

## 2019-02-27 NOTE — ED Provider Notes (Signed)
[facility] EMERGENCY DEPARTMENT Provider Note   CSN: [id number] Arrival date & time: 02/27/19  1930    History   Chief Complaint Chief Complaint  Patient presents with   Loss of Consciousness   Hypotension    HPI Clinton Thomas is a 73 y.o. male.     The history is provided by the patient and the EMS personnel.  Loss of Consciousness  Episode history:  Single Most recent episode:  Today Progression:  Resolved Chronicity:  New Context: sitting down   Witnessed: yes   Relieved by: fluids, rest. Worsened by:  Nothing Associated symptoms: no chest pain, no confusion, no difficulty breathing, no dizziness, no fever, no malaise/fatigue, no nausea, no palpitations, no rectal bleeding, no seizures, no shortness of breath and no vomiting     Past Medical History:  Diagnosis Date   Atrial flutter ([place])    Colon polyp    Gout    Hepatitis C    Hx of echocardiogram    Echo 9/13:  mod to severe LVH, EF 70%, no MV SAM, no LVOT gradient, slight intracavitary LV gradient, mod BAE, mild TR, PASP 33   Hypertension    Microcytic anemia    colo at [facility] 2012 "ok" per patient   Peripheral neuropathy    Thrombocytopenia ([place])    evaluated by Heme in past     Patient Active Problem List   Diagnosis Date Noted   Syncope 02/27/2019   CKD (chronic kidney disease) stage 3, GFR 30-59 ml/min (HCC) 02/27/2019   Paroxysmal atrial flutter ([place]) 09/13/2013   On continuous oral anticoagulation, beginning today for possible ablation 09/13/2013   Hx of colonic polyps 08/08/2012   Hx of hepatitis C 08/08/2012   Peripheral neuropathy 08/08/2012   Anemia 08/08/2012   Hx of thrombocytopenia 08/08/2012   Atrial flutter ([place]) 07/30/2012   GOUT 10/12/2010   HYPERTENSION 10/12/2010    Past Surgical History:  Procedure Laterality Date   BACK SURGERY     CERVICAL DISC SURGERY     REVISION TOTAL KNEE ARTHROPLASTY     Both        Home  Medications    Prior to Admission medications   Medication Sig Start Date End Date Taking? Authorizing Provider  allopurinol (ZYLOPRIM) 300 MG tablet Take 300 mg by mouth daily.    [provider]  atorvastatin (LIPITOR) 40 MG tablet Take 40 mg by mouth at bedtime.    [provider]  dicyclomine (BENTYL) 20 MG tablet Take 1 tablet (20 mg total) by mouth 2 (two) times daily. 02/27/18   [name], MD  diltiazem (TIAZAC) 360 MG 24 hr capsule Take 360 mg by mouth daily.    [provider]  furosemide (LASIX) 40 MG tablet Take 20 mg by mouth daily.    [provider]  lisinopril (PRINIVIL,ZESTRIL) 40 MG tablet Take 20 mg by mouth daily.     [provider]  methocarbamol (ROBAXIN) 500 MG tablet Take 500 mg by mouth 3 (three) times daily as needed for muscle spasms.    [provider]  ondansetron (ZOFRAN ODT) 4 MG disintegrating tablet Take 1 tablet (4 mg total) by mouth every 8 (eight) hours as needed. 02/27/18   [name], MD  potassium chloride SA (K-DUR,KLOR-CON) 20 MEQ tablet Take 20 mEq by mouth 2 (two) times daily.    [provider]  Rivaroxaban (XARELTO) 20 MG TABS tablet Take 1 tablet (20 mg total) by mouth  daily. Patient not taking: Reported on 02/27/2018 09/13/13   [name], NP  sertraline (ZOLOFT) 100 MG tablet Take 200 mg by mouth daily.     [provider]  sodium chloride (OCEAN) 0.65 % nasal spray Place 2 sprays into the nose 2 (two) times daily as needed for congestion.    [provider]  traZODone (DESYREL) 100 MG tablet Take 100 mg by mouth at bedtime as needed for sleep.    [provider]    Family History Family History  Problem Relation Age of Onset   Stroke Mother    COPD Father    Arthritis Sister     Social History Social History   Tobacco Use   Smoking status: Former Smoker   Smokeless tobacco: Never Used  Substance Use Topics   Alcohol use: Yes     Comment: Occasional   Drug use: Never     Allergies   Linezolid   Review of Systems Review of Systems  Constitutional: Negative for chills, fever and malaise/fatigue.  HENT: Negative for ear pain and sore throat.   Eyes: Negative for pain and visual disturbance.  Respiratory: Negative for cough and shortness of breath.   Cardiovascular: Positive for syncope. Negative for chest pain and palpitations.  Gastrointestinal: Negative for abdominal pain, nausea and vomiting.  Genitourinary: Negative for dysuria and hematuria.  Musculoskeletal: Negative for arthralgias and back pain.  Skin: Negative for color change and rash.  Neurological: Positive for syncope and light-headedness. Negative for dizziness and seizures.  Psychiatric/Behavioral: Negative for confusion.  All other systems reviewed and are negative.    Physical Exam  ED Triage Vitals  Enc Vitals Group     BP 02/27/19 1934 100/62     Pulse Rate 02/27/19 1934 64     Resp 02/27/19 1937 13     Temp 02/27/19 1934 98 F (36.7 C)     Temp Source 02/27/19 1934 Oral     SpO2 02/27/19 1937 100 %     Weight 02/27/19 2308 207 lb 8 oz (94.1 kg)     Height 02/27/19 2308 6\' 1"  (1.854 m)     Head Circumference --      Peak Flow --      Pain Score --      Pain Loc --      Pain Edu? --      Excl. in [place]? --     Physical Exam Vitals signs and nursing note reviewed.  Constitutional:      General: He is not in acute distress.    Appearance: He is well-developed. He is not ill-appearing.  HENT:     Head: Normocephalic and atraumatic.     Nose: Nose normal.     Mouth/Throat:     Mouth: Mucous membranes are dry.  Eyes:     Extraocular Movements: Extraocular movements intact.     Conjunctiva/sclera: Conjunctivae normal.     Pupils: Pupils are equal, round, and reactive to light.  Neck:     Musculoskeletal: Neck supple.  Cardiovascular:     Rate and Rhythm: Normal rate and regular rhythm.     Heart sounds: No murmur.   Pulmonary:     Effort: Pulmonary effort is normal. No respiratory distress.     Breath sounds: Normal breath sounds.  Abdominal:     General: There is no distension.     Palpations: Abdomen is soft.     Tenderness: There is no abdominal tenderness.  Genitourinary:  Rectum: Guaiac result negative.  Musculoskeletal:     Right lower leg: No edema.     Left lower leg: No edema.  Skin:    General: Skin is warm and dry.  Neurological:     General: No focal deficit present.     Mental Status: He is alert.     Cranial Nerves: No cranial nerve deficit.     Sensory: No sensory deficit.     Motor: No weakness.  Psychiatric:        Mood and Affect: Mood normal.      ED Treatments / Results  Labs (all labs ordered are listed, but only abnormal results are displayed) Labs Reviewed  CBC WITH DIFFERENTIAL/PLATELET - Abnormal; Notable for the following components:      Result Value   RBC 3.78 (*)    Hemoglobin 9.8 (*)    HCT 32.0 (*)    MCH 25.9 (*)    RDW 15.9 (*)    Platelets 110 (*)    All other components within normal limits  BASIC METABOLIC PANEL - Abnormal; Notable for the following components:   CO2 20 (*)    Glucose, Bld 112 (*)    BUN 34 (*)    Creatinine, Ser 2.17 (*)    GFR calc non Af Amer 29 (*)    GFR calc Af Amer 34 (*)    All other components within normal limits  URINALYSIS, ROUTINE W REFLEX MICROSCOPIC - Abnormal; Notable for the following components:   Leukocytes,Ua TRACE (*)    Bacteria, UA RARE (*)    All other components within normal limits  RAPID URINE DRUG SCREEN, HOSP PERFORMED - Abnormal; Notable for the following components:   Tetrahydrocannabinol POSITIVE (*)    All other components within normal limits  CBG MONITORING, ED - Abnormal; Notable for the following components:   Glucose-Capillary 106 (*)    All other components within normal limits  POC OCCULT BLOOD, ED - Abnormal; Notable for the following components:   Fecal Occult Bld POSITIVE  (*)    All other components within normal limits  URINE CULTURE  HEPATIC FUNCTION PANEL  TROPONIN I  MAGNESIUM  ETHANOL  IRON AND TIBC  FERRITIN  CBC  BASIC METABOLIC PANEL    EKG EKG Interpretation  Date/Time:  Wednesday February 27 2019 19:42:04 EDT Ventricular Rate:  83 PR Interval:    QRS Duration: 96 QT Interval:  418 QTC Calculation: 451 R Axis:   6 Text Interpretation:  Sinus rhythm Paired ventricular premature complexes Confirmed by Lennice Sites [phone]) on 02/27/2019 7:46:28 PM   Radiology Ct Head Wo Contrast  Result Date: 02/27/2019 CLINICAL DATA:  Syncope. EXAM: CT HEAD WITHOUT CONTRAST TECHNIQUE: Contiguous axial images were obtained from the base of the skull through the vertex without intravenous contrast. COMPARISON:  None. FINDINGS: Brain: No intracranial hemorrhage, mass effect, or midline shift. Generalized atrophy may be slightly advanced for age. Mild chronic small vessel ischemia. Remote lacunar infarcts in the bilateral basal ganglia. No hydrocephalus. The basilar cisterns are patent. No evidence of territorial infarct or acute ischemia. No extra-axial or intracranial fluid collection. Vascular: Atherosclerosis of skullbase vasculature without hyperdense vessel or abnormal calcification. Skull: No fracture or focal lesion. Sinuses/Orbits: Small mucous retention cyst in the left maxillary sinus. No acute findings. Other: None. IMPRESSION: 1. No acute intracranial abnormality. 2. Mild atrophy and chronic small vessel ischemia. 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 Electronically Signed   By: Aurther Loft.D.  On: 02/27/2019 20:56   Dg Chest Port 1 View  Result Date: 02/27/2019 CLINICAL DATA:  Initial evaluation for acute syncope. EXAM: PORTABLE CHEST 1 VIEW COMPARISON:  Prior radiograph from 02/27/2018 FINDINGS: Cardiac and mediastinal silhouettes are stable in size and contour, and remain within normal limits. Lungs normally inflated. Mild streaky left  basilar subsegmental atelectasis. No focal infiltrates. No pulmonary edema or pleural effusion. No pneumothorax. No acute osseous finding. IMPRESSION: 1. Mild left basilar subsegmental atelectasis. 2. No other active cardiopulmonary disease. Electronically Signed   By: Jeannine Boga M.D.   On: 02/27/2019 20:28    Procedures Procedures (including critical care time)  Medications Ordered in ED Medications  sodium chloride flush (NS) 0.9 % injection 3 mL (3 mLs Intravenous Given 02/27/19 2332)  acetaminophen (TYLENOL) tablet 650 mg (has no administration in time range)    Or  acetaminophen (TYLENOL) suppository 650 mg (has no administration in time range)  0.9 %  sodium chloride infusion ( Intravenous New Bag/Given 02/27/19 2332)  lactated ringers bolus 1,000 mL (1,000 mLs Intravenous New Bag/Given 02/27/19 1954)     Initial Impression / Assessment and Plan / ED Course  I have reviewed the triage vital signs and the nursing notes.  Pertinent labs & imaging results that were available during my care of the patient were reviewed by me and considered in my medical decision making (see chart for details).     JARL SELLITTO is a 73 year old male with history of hypertension, hepatitis who presents to the ED with loss of consciousness.  Patient with normal vitals.  No fever.  Patient was in his normal state of health prior to arrival when family found him slumped over in a chair, unresponsive, blue in the face, not breathing well.  Patient was found to be possibly hypotensive with EMS and he was given 250 cc of fluid.  Blood pressure is normal upon arrival.  Vitals overall normal upon arrival.  Patient with normal neurological exam.  Denies any chest pain, shortness of breath.  He states that he was sitting on his chair outside does not remember what happened.  Patient did not hit his head.  Does admit to daily marijuana use but no other drug use or alcohol use.  Patient with concerning story for  possible arrhythmia versus electrolyte abnormality versus dehydration versus polysubstance abuse.  Patient with hemoglobin of 9.8.  Last hemoglobin was about a year ago that was 15.  Patient has no gross melena or hematochezia on rectal exam.  Occult test however was positive.  Patient with no other significant electrolyte abnormality.  Creatinine appears to be around baseline.  Patient with no acute findings on head CT or chest x-ray.  EKG shows sinus rhythm.  Troponin within normal limits.  Alcohol level is unremarkable.  Overall patient has concerning syncope story.  Would benefit from echocardiogram, telemetry.  Would recommend trending hemoglobin as well.  No concern for GI bleed at this time but given significant drop in hemoglobin over the past year would be reasonable to trend.  Patient remained hemodynamically stable throughout my care and was admitted to medicine service for further syncope work-up.  This chart was dictated using voice recognition software.  Despite best efforts to proofread,  errors can occur which can change the documentation meaning.    Final Clinical Impressions(s) / ED Diagnoses   Final diagnoses:  Syncope and collapse    ED Discharge Orders    None       Toddrick Sanna,  [place], DO 02/27/19 2336

## 2019-02-27 NOTE — ED Triage Notes (Signed)
Was on his back porch with family when they left him unattended for about 15-20 mins, when they came back outside he was unresponsive, blue in color, and agonal breathing. Ems called, family notes he is on 4 BP medications.

## 2019-02-28 ENCOUNTER — Observation Stay (HOSPITAL_COMMUNITY): Payer: Medicare HMO

## 2019-02-28 ENCOUNTER — Observation Stay (HOSPITAL_BASED_OUTPATIENT_CLINIC_OR_DEPARTMENT_OTHER): Payer: Medicare HMO

## 2019-02-28 DIAGNOSIS — R55 Syncope and collapse: Secondary | ICD-10-CM

## 2019-02-28 DIAGNOSIS — D649 Anemia, unspecified: Secondary | ICD-10-CM | POA: Diagnosis not present

## 2019-02-28 DIAGNOSIS — I361 Nonrheumatic tricuspid (valve) insufficiency: Secondary | ICD-10-CM | POA: Diagnosis not present

## 2019-02-28 DIAGNOSIS — I371 Nonrheumatic pulmonary valve insufficiency: Secondary | ICD-10-CM | POA: Diagnosis not present

## 2019-02-28 LAB — BASIC METABOLIC PANEL
Anion gap: 8 (ref 5–15)
BUN: 31 mg/dL — ABNORMAL HIGH (ref 8–23)
CO2: 22 mmol/L (ref 22–32)
Calcium: 9.4 mg/dL (ref 8.9–10.3)
Chloride: 110 mmol/L (ref 98–111)
Creatinine, Ser: 1.78 mg/dL — ABNORMAL HIGH (ref 0.61–1.24)
GFR calc Af Amer: 43 mL/min — ABNORMAL LOW (ref >60)
GFR calc non Af Amer: 37 mL/min — ABNORMAL LOW (ref >60)
Glucose, Bld: 88 mg/dL (ref 70–99)
Potassium: 4.3 mmol/L (ref 3.5–5.1)
Sodium: 140 mmol/L (ref 135–145)

## 2019-02-28 LAB — ECHOCARDIOGRAM COMPLETE
Height: 73 in
Weight: 3312.19 oz

## 2019-02-28 LAB — FERRITIN: Ferritin: 57 ng/mL (ref 24–336)

## 2019-02-28 LAB — CBC
HCT: 30.5 % — ABNORMAL LOW (ref 39.0–52.0)
Hemoglobin: 9.4 g/dL — ABNORMAL LOW (ref 13.0–17.0)
MCH: 25.6 pg — ABNORMAL LOW (ref 26.0–34.0)
MCHC: 30.8 g/dL (ref 30.0–36.0)
MCV: 83.1 fL (ref 80.0–100.0)
Platelets: 108 10*3/uL — ABNORMAL LOW (ref 150–400)
RBC: 3.67 MIL/uL — ABNORMAL LOW (ref 4.22–5.81)
RDW: 15.9 % — ABNORMAL HIGH (ref 11.5–15.5)
WBC: 6.7 10*3/uL (ref 4.0–10.5)
nRBC: 0 % (ref 0.0–0.2)

## 2019-02-28 LAB — IRON AND TIBC
Iron: 65 ug/dL (ref 45–182)
Saturation Ratios: 20 % (ref 17.9–39.5)
TIBC: 322 ug/dL (ref 250–450)
UIBC: 257 ug/dL

## 2019-02-28 LAB — GLUCOSE, CAPILLARY: Glucose-Capillary: 73 mg/dL (ref 70–99)

## 2019-02-28 MED ORDER — AMLODIPINE BESYLATE 2.5 MG PO TABS: 2.5000 mg | ORAL_TABLET | Freq: Every day | ORAL | Status: DC

## 2019-02-28 MED ORDER — TRAZODONE HCL 100 MG PO TABS
100.0000 mg | ORAL_TABLET | Freq: Every evening | ORAL | Status: DC | PRN
  Administered 2019-02-28: 23:00:00 100 mg via ORAL
  Filled 2019-02-28: qty 1

## 2019-02-28 MED ORDER — ALLOPURINOL 300 MG PO TABS
150.0000 mg | ORAL_TABLET | Freq: Every day | ORAL | Status: DC
  Administered 2019-02-28 – 2019-03-01 (×2): 150 mg via ORAL
  Filled 2019-02-28 (×2): qty 1

## 2019-02-28 MED ORDER — SERTRALINE HCL 100 MG PO TABS
200.0000 mg | ORAL_TABLET | Freq: Every day | ORAL | Status: DC
  Administered 2019-02-28 – 2019-03-01 (×2): 200 mg via ORAL
  Filled 2019-02-28 (×2): qty 2

## 2019-02-28 MED ORDER — DOCUSATE SODIUM 100 MG PO CAPS
100.0000 mg | ORAL_CAPSULE | Freq: Every day | ORAL | Status: DC
  Administered 2019-02-28 – 2019-03-01 (×2): 100 mg via ORAL
  Filled 2019-02-28 (×2): qty 1

## 2019-02-28 MED ORDER — CARVEDILOL 12.5 MG PO TABS: 12.5000 mg | ORAL_TABLET | Freq: Two times a day (BID) | ORAL | Status: DC

## 2019-02-28 MED ORDER — BENZONATATE 100 MG PO CAPS
100.0000 mg | ORAL_CAPSULE | Freq: Three times a day (TID) | ORAL | Status: DC
  Administered 2019-02-28 – 2019-03-01 (×3): 100 mg via ORAL
  Filled 2019-02-28 (×3): qty 1

## 2019-02-28 MED ORDER — ATORVASTATIN CALCIUM 40 MG PO TABS
40.0000 mg | ORAL_TABLET | Freq: Every day | ORAL | Status: DC
  Administered 2019-02-28: 40 mg via ORAL
  Filled 2019-02-28: qty 1

## 2019-02-28 MED ORDER — GABAPENTIN 300 MG PO CAPS
300.0000 mg | ORAL_CAPSULE | Freq: Two times a day (BID) | ORAL | Status: DC
  Administered 2019-02-28 – 2019-03-01 (×3): 300 mg via ORAL
  Filled 2019-02-28 (×3): qty 1

## 2019-02-28 MED ORDER — PANTOPRAZOLE SODIUM 40 MG PO TBEC
40.0000 mg | DELAYED_RELEASE_TABLET | Freq: Every day | ORAL | Status: DC
  Administered 2019-02-28 – 2019-03-01 (×2): 40 mg via ORAL
  Filled 2019-02-28 (×2): qty 1

## 2019-02-28 NOTE — Progress Notes (Signed)
PROGRESS NOTE    Clinton Thomas  ZOX:[id number] DOB: 06-17-1946 DOA: 02/27/2019 PCP: System, Provider Not In   Brief Narrative: 73 y.o. male with medical history significant of atrial flutter, hypertension, hepatitis C, gout, colon polyp presenting to the hospital via EMS for evaluation of loss of consciousness.  Per EMS report, family found the patient slumped over in a chair, unresponsive, blue in the face, and not breathing well. Patient was found to be possibly hypotensive with EMS and was given 250 cc fluid bolus. Hemodynamically stable upon arrival. Patient states he was in his usual state of health and sweeping his porch today and then just remembers waking up on the ground with his family around him.  Denies any dizziness/lightheadedness, chest pain, or shortness of breath prior to the syncopal episode.  Denies any recent illness, fevers, chills, chest pain, shortness of breath, rhinorrhea, sore throat, abdominal pain, nausea, vomiting, diarrhea, dysuria, or urinary frequency/urgency.  Reports smoking marijuana chronically but denies any other drug use.  Denies alcohol use.  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  He is also a former smoker.  No change in his chronic cough and it has not been bothering him recently.  Denies any recent travel, sick contacts, or exposure to any individual with confirmed COVID-19.  Reports having dark colored stools sometimes.  No hematochezia.  Denies any history of seizures.  States his PCP is Dr. Hassell Done at [facility]. States he had ablation done for his atrial flutter at the [place] within the past year. He was seen in ER and admitted for further work up  Subjective: Seen this morning patient is doing well no acute complaints. Denies any complaints.  Assessment & Plan:   Syncope: no significant findings on tele. Echocardiogram nl LVEF and no significant findings Carotid  Doppler showed right ICA  with a 60-79%  stenosis.Will gently hydrate with IV fluids, orthostatics ordered and negative per RN.Remains hemodynamically stable currently. Has anemia but no acute blood loss and hb stable in 9gm.  Per note possibly hypotension for EMS needing 250 ml IV bolus, and this could explain his syncope and his multiple antihypertensive regimens remains on hold and Bp is stable. Check EEG- but doubt any seizure..  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 We will ask vascular evaluation. Resume asa once okay with GI.  Anemia, recent hb was 15.3 gm a year ago FOBT is positive but hb stable at 9.4-9.8 gm and no obvious bleeding. Last EGF/Colo in 2013 with 2 very superficial 1 mm clean-based erosions in the gastric body and erythema in the duodenal bulb negative for dysplasia or malignancy colonoscopy showed sessile polyp at the cecum that was removed. Iron panel  w normal ferritin.tibs and fe. Known to [facility] and sent page to [place] and discussed for consult.  CKD III, Previous creatinine was 1.8-2.0 in 2019, creatinine 2.1 has improved 1.7 with IV fluid hydration. His UA normal.  UDS + for THC.  HTN: cont to hole home meds  Hx of A Flutter-no taking anticoagulation currently.HR stable. Hx of thrombocytopenia : Platelet count low but stable  DVT prophylaxis: SCD Code Status: full code Family Communication: patient Disposition Plan: remains inpatient pending clinical improvement.  Consultants:  GI Vascular  Procedures:  ECHO 1.The left ventricle has normal systolic function, with an ejection fraction of 55-60%. The cavity size was normal. There is mildly increased left ventricular  wall thickness. Left ventricular diastolic parameters were normal.  2. Mildly decreased GLS -14.2.  3. The right ventricle has normal systolic function. The cavity was normal. There is no increase in right ventricular wall thickness.  4. Left atrial size was mildly dilated.  5. Mild thickening of the  mitral valve leaflet. Mild calcification of the mitral valve leaflet. There is mild mitral annular calcification present.  6. The tricuspid valve is grossly normal.  7. The aortic valve was not well visualized. Moderate thickening of the aortic valve. Severe calcifcation of the aortic valve. Aortic valve regurgitation is trivial by color flow Doppler.  8. There is mild dilatation of the aortic root measuring 39 mm.  CAROTIDS US Right Carotid: Velocities in the right ICA are consistent with a 60-79% stenosis. Left Carotid: Velocities in the left ICA are consistent with a 1-39% stenosis. Vertebrals: Bilateral vertebral arteries demonstrate antegrade flow  Antimicrobials: Anti-infectives (From admission, onward)   None       Objective: Vitals:   02/27/19 2100 02/27/19 2115 02/27/19 2308 02/28/19 0421  BP: 113/71 139/80 (!) 156/94 130/69  Pulse: 70 65 67 (!) 56  Resp: 16 12 16 18   Temp:   98.1 F (36.7 C) 97.7 F (36.5 C)  TempSrc:   Oral Oral  SpO2: 100% 100% 99% 100%  Weight:   94.1 kg 93.9 kg  Height:   6\' 1"  (1.854 m)     Intake/Output Summary (Last 24 hours) at 02/28/2019 1058 Last data filed at 02/28/2019 0600 Gross per 24 hour  Intake 500 ml  Output 1500 ml  Net -1000 ml   Filed Weights   02/27/19 2308 02/28/19 0421  Weight: 94.1 kg 93.9 kg   Weight change:   Body mass index is 27.31 kg/m.  Intake/Output from previous day: 04/08 0701 - 04/09 0700 In: 500 [P.O.:250; I.V.:250] Out: 1500 [Urine:1500] Intake/Output this shift: No intake/output data recorded.  Examination:  General exam: Appears calm and comfortable,Not in distress, older fore the age HEENT:PERRL,Oral mucosa moist, Ear/Nose normal on gross exam Respiratory system: Bilateral equal air entry, normal vesicular breath sounds, no wheezes or crackles  Cardiovascular system: S1 & S2 heard,No JVD, murmurs. Gastrointestinal system: Abdomen is  soft, non tender, non distended, BS +  Nervous System:Alert  and oriented. No focal neurological deficits/moving extremities, sensation intact. Extremities: No edema, no clubbing, distal peripheral pulses palpable. Skin: No rashes, lesions, no icterus MSK: Normal muscle bulk,tone ,power  Medications:  Scheduled Meds:  allopurinol  150 mg Oral Daily   amLODipine  2.5 mg Oral Daily   atorvastatin  40 mg Oral QHS   benzonatate  100 mg Oral TID   carvedilol  12.5 mg Oral BID WC   docusate sodium  100 mg Oral Daily   gabapentin  300 mg Oral BID   pantoprazole  40 mg Oral Daily   sertraline  200 mg Oral Daily   sodium chloride flush  3 mL Intravenous Q12H   Continuous Infusions:  sodium chloride 125 mL/hr at 02/27/19 2332    Data Reviewed: I have personally reviewed following labs and imaging studies  CBC: Recent Labs  Lab 02/27/19 1944 02/28/19 0508  WBC 5.4 6.7  NEUTROABS 3.5  --   HGB 9.8* 9.4*  HCT 32.0* 30.5*  MCV 84.7 83.1  PLT 110* 662*   Basic Metabolic Panel: Recent Labs  Lab 02/27/19 1944 02/28/19 0508  NA 137 140  K 4.5 4.3  CL 107 110  CO2 20* 22  GLUCOSE  112* 88  BUN 34* 31*  CREATININE 2.17* 1.78*  CALCIUM 9.3 9.4  MG 2.1  --    GFR: Estimated Creatinine Clearance: 42.4 mL/min (A) (by C-G formula based on SCr of 1.78 mg/dL (H)). Liver Function Tests: Recent Labs  Lab 02/27/19 1944  AST 26  ALT 22  ALKPHOS 64  BILITOT 0.3  PROT 7.2  ALBUMIN 3.9   No results for input(s): LIPASE, AMYLASE in the last 168 hours. No results for input(s): AMMONIA in the last 168 hours. Coagulation Profile: No results for input(s): INR, PROTIME in the last 168 hours. Cardiac Enzymes: Recent Labs  Lab 02/27/19 1944  TROPONINI <0.03   BNP (last 3 results) No results for input(s): PROBNP in the last 8760 hours. HbA1C: No results for input(s): HGBA1C in the last 72 hours. CBG: Recent Labs  Lab 02/27/19 2001 02/28/19 0641  GLUCAP 106* 73   Lipid Profile: No results for input(s): CHOL, HDL, LDLCALC,  TRIG, CHOLHDL, LDLDIRECT in the last 72 hours. Thyroid Function Tests: No results for input(s): TSH, T4TOTAL, FREET4, T3FREE, THYROIDAB in the last 72 hours. Anemia Panel: Recent Labs    02/28/19 0508  FERRITIN 57  TIBC 322  IRON 65   Sepsis Labs: No results for input(s): PROCALCITON, LATICACIDVEN in the last 168 hours.  No results found for this or any previous visit (from the past 240 hour(s)).    Radiology Studies: Ct Head Wo Contrast  Result Date: 02/27/2019 CLINICAL DATA:  Syncope. EXAM: CT HEAD WITHOUT CONTRAST TECHNIQUE: Contiguous axial images were obtained from the base of the skull through the vertex without intravenous contrast. COMPARISON:  None. FINDINGS: Brain: No intracranial hemorrhage, mass effect, or midline shift. Generalized atrophy may be slightly advanced for age. Mild chronic small vessel ischemia. Remote lacunar infarcts in the bilateral basal ganglia. No hydrocephalus. The basilar cisterns are patent. No evidence of territorial infarct or acute ischemia. No extra-axial or intracranial fluid collection. Vascular: Atherosclerosis of skullbase vasculature without hyperdense vessel or abnormal calcification. Skull: No fracture or focal lesion. Sinuses/Orbits: Small mucous retention cyst in the left maxillary sinus. No acute findings. Other: None. IMPRESSION: 1. No acute intracranial abnormality. 2. Mild atrophy and chronic small vessel ischemia. 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 Electronically Signed   By: Keith Rake M.D.   On: 02/27/2019 20:56   Dg Chest Port 1 View  Result Date: 02/27/2019 CLINICAL DATA:  Initial evaluation for acute syncope. EXAM: PORTABLE CHEST 1 VIEW COMPARISON:  Prior radiograph from 02/27/2018 FINDINGS: Cardiac and mediastinal silhouettes are stable in size and contour, and remain within normal limits. Lungs normally inflated. Mild streaky left basilar subsegmental atelectasis. No focal infiltrates. No pulmonary edema or pleural  effusion. No pneumothorax. No acute osseous finding. IMPRESSION: 1. Mild left basilar subsegmental atelectasis. 2. No other active cardiopulmonary disease. Electronically Signed   By: Jeannine Boga M.D.   On: 02/27/2019 20:28   Vas US Carotid  Result Date: 02/28/2019 Carotid Arterial Duplex Study Indications: Syncope. Performing Technologist: Abram Sander RVS  Examination Guidelines: A complete evaluation includes B-mode imaging, spectral Doppler, color Doppler, and power Doppler as needed of all accessible portions of each vessel. Bilateral testing is considered an integral part of a complete examination. Limited examinations for reoccurring indications may be performed as noted.  Right Carotid Findings: +----------+--------+--------+--------+-----------+--------+             PSV cm/s EDV cm/s Stenosis Describe    Comments  +----------+--------+--------+--------+-----------+--------+  CCA Prox   88  13                homogeneous           +----------+--------+--------+--------+-----------+--------+  CCA Distal 66       17                homogeneous           +----------+--------+--------+--------+-----------+--------+  ICA Prox   194      82       60-79%   homogeneous           +----------+--------+--------+--------+-----------+--------+  ICA Mid    129      25                                      +----------+--------+--------+--------+-----------+--------+  ICA Distal 75       27                                      +----------+--------+--------+--------+-----------+--------+  ECA        96       10                                      +----------+--------+--------+--------+-----------+--------+ +----------+--------+-------+--------+-------------------+             PSV cm/s EDV cms Describe Arm Pressure (mmHG)  +----------+--------+-------+--------+-------------------+  Subclavian 60                                             +----------+--------+-------+--------+-------------------+  +---------+--------+--+--------+--+---------+  Vertebral PSV cm/s 77 EDV cm/s 21 Antegrade  +---------+--------+--+--------+--+---------+  Left Carotid Findings: +----------+--------+--------+--------+-----------+--------+             PSV cm/s EDV cm/s Stenosis Describe    Comments  +----------+--------+--------+--------+-----------+--------+  CCA Prox   109      15                homogeneous           +----------+--------+--------+--------+-----------+--------+  CCA Distal 53       11                homogeneous           +----------+--------+--------+--------+-----------+--------+  ICA Prox   79       22       1-39%    homogeneous           +----------+--------+--------+--------+-----------+--------+  ICA Distal 116      34                                      +----------+--------+--------+--------+-----------+--------+  ECA        55                                               +----------+--------+--------+--------+-----------+--------+ +----------+--------+--------+--------+-------------------+  Subclavian PSV cm/s EDV cm/s Describe Arm Pressure (mmHG)  +----------+--------+--------+--------+-------------------+  70                                              +----------+--------+--------+--------+-------------------+ +---------+--------+--+--------+--+---------+  Vertebral PSV cm/s 50 EDV cm/s 17 Antegrade  +---------+--------+--+--------+--+---------+  Summary: Right Carotid: Velocities in the right ICA are consistent with a 60-79%                stenosis. Left Carotid: Velocities in the left ICA are consistent with a 1-39% stenosis. Vertebrals: Bilateral vertebral arteries demonstrate antegrade flow. *See table(s) above for measurements and observations.     Preliminary     LOS: 0 days  Time spent: More than 50% of that time was spent in counseling and/or coordination of care.  Antonieta Pert, MD [facility]  02/28/2019, 10:58 AM

## 2019-02-28 NOTE — TOC Initial Note (Signed)
Transition of Care [facility]) - Initial/Assessment Note    Patient Details  Name: Clinton Thomas MRN: [id number] Date of Birth: February 24, 1946  [facility] CM/SW Contact:    Royston Bake, RN Phone Number: 02/28/2019, 11:49 AM  Clinical Narrative:                 Patient lives at home with spouse; goes to the [facility] PCP is Dr Hassell Done; has private insurance with [place]; he gets his medication through the [employer] order and [facility]. DME - walker, cane and wheelchair at home; CM talked to spouse via phone with pt permission;  Patient was going to Outpatient PT prior to admission through the [place] but it is on hold now due to the [place] but plans to resume soon. CM will continue to follow for progression of care.  Expected Discharge Plan: Home/Self Care Barriers to Discharge: No Barriers Identified   Patient Goals and CMS Choice Patient states their goals for this hospitalization and ongoing recovery are:: to get better CMS Medicare.gov Compare Post Acute Care list provided to:: Patient Choice offered to / list presented to : NA  Expected Discharge Plan and Services Expected Discharge Plan: Home/Self Care   Discharge Planning Services: CM Consult   Living arrangements for the past 2 months: Single Family Home                 DME Arranged: N/A DME Agency: NA HH Arranged: NA HH Agency: NA  Prior Living Arrangements/Services Living arrangements for the past 2 months: Single Family Home Lives with:: Spouse Patient language and need for interpreter reviewed:: No Do you feel safe going back to the place where you live?: Yes      Need for Family Participation in Patient Care: No (Comment) Care giver support system in place?: Yes (comment)   Criminal Activity/Legal Involvement Pertinent to Current Situation/Hospitalization: No - Comment as needed  Activities of Daily Living Home Assistive Devices/Equipment: Cane (specify quad or straight) ADL  Screening (condition at time of admission) Patient's cognitive ability adequate to safely complete daily activities?: Yes Is the patient deaf or have difficulty hearing?: No Does the patient have difficulty seeing, even when wearing glasses/contacts?: No Does the patient have difficulty concentrating, remembering, or making decisions?: No Patient able to express need for assistance with ADLs?: Yes Does the patient have difficulty dressing or bathing?: Yes Independently performs ADLs?: No Communication: Independent Is this a change from baseline?: Change from baseline, expected to last >3 days Dressing (OT): Needs assistance Is this a change from baseline?: Change from baseline, expected to last >3 days Grooming: Independent Feeding: Independent Bathing: Independent Toileting: Needs assistance In/Out Bed: Needs assistance Walks in Home: Independent with device (comment)(cane  walker) Does the patient have difficulty walking or climbing stairs?: No Weakness of Legs: Both Weakness of Arms/Hands: None  Permission Sought/Granted Permission sought to share information with : Case Manager Permission granted to share information with : Yes, Verbal Permission Granted  Share Information with NAME: Spouse Eunice           Emotional Assessment Appearance:: Developmentally appropriate Attitude/Demeanor/Rapport: Gracious Affect (typically observed): Accepting Orientation: : Oriented to Self, Oriented to Situation(Early dementia per 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 Syncope 02/27/2019  . CKD (chronic kidney disease) stage 3, GFR 30-59  ml/min ([place]) 02/27/2019  . Paroxysmal atrial flutter ([place]) 09/13/2013  . On continuous oral anticoagulation, beginning today for possible ablation 09/13/2013  . Hx of colonic  polyps 08/08/2012  . Hx of hepatitis C 08/08/2012  . Peripheral neuropathy 08/08/2012  . Anemia 08/08/2012  . Hx of thrombocytopenia 08/08/2012  . Atrial flutter ([place]) 07/30/2012  . GOUT 10/12/2010  . HYPERTENSION 10/12/2010   PCP:  System, Provider Not In Pharmacy:  No Pharmacies Listed    Social Determinants of Health (SDOH) Interventions    Readmission Risk Interventions No flowsheet data found.

## 2019-02-28 NOTE — Progress Notes (Signed)
Carotid duplex has been completed.   Preliminary results in CV Proc.   Abram Sander 02/28/2019 8:45 AM

## 2019-02-28 NOTE — Consult Note (Signed)
[facility] Consult: 12:44 PM 02/28/2019  LOS: 0 days    Referring Provider: DR Maren Beach  Primary Care Physician: Obtains his medical care at the [facility].,  PCP Dr. Hassell Done. Primary Gastroenterologist:  Althia Forts    Reason for Consultation: FOBT positive anemia.   HPI: Clinton Thomas is a 73 y.o. male.  Former Company secretary.  Hepatitis C, virus eradicated with meds per wife.  Ultrasound from 05/2015 showed ? coarsened echotexture of the liver..  Thrombocytopenia, platelets in the 70s in 07/2017.  History of GI bleed, details not known with colonoscopy and EGD, notes mention possible AVMs.  Anemia.  Received 1 unit of blood 07/2017.  Hypertension.  Atrial flutter.  Previous cardiac ablation.  Chronic Xarelto.  Marijuana smoker.  Early dementia.  Colonoscopy ~ 2017 at the [facility]   Per wife: No unusual findings, patient was told he would not need another colonoscopy.  Syncopal episode while sweeping his porch.  Found blue in the face, not breathing.  Hypotensive per EMS. Hgb 9.8 >> 9.4.  Platelets 108.  MCV 83.  Hgb 11, MCV 78 in 07/2017.    AKI. Normal LFTs Anemia profile reveals no iron deficiency.  Low normal iron saturation. FOBT positive Screen positive for THC. Head CT reveals nothing acute just mild atrophy and chronic small vessel ischemia, remote lacunar infarcts. Carotid Dopplers show bilateral ICA stenosis, 60 to 79% on the right, 1 to 39% on the left.  Previously had issues with reflux disease but with the Protonix that has resolved.  Patient denies abdominal pain his appetite is good.  No dysphagia.  No nausea or vomiting.  No bloody or black stools.  In addition to Eliquis he takes low-dose aspirin, tonics and occasional Advil. Smokes marijuana but no alcohol use.  He is tobacco smoker.  Past Medical History:   Diagnosis Date  . Atrial flutter ([place])   . Colon polyp   . Gout   . Hepatitis C   . Hx of echocardiogram    Echo 9/13:  mod to severe LVH, EF 70%, no MV SAM, no LVOT gradient, slight intracavitary LV gradient, mod BAE, mild TR, PASP 33  . Hypertension   . Microcytic anemia    colo at [facility] 2012 "ok" per patient  . Peripheral neuropathy   . Thrombocytopenia ([place])    evaluated by Heme in past     Past Surgical History:  Procedure Laterality Date  . BACK SURGERY    . CERVICAL DISC SURGERY    . REVISION TOTAL KNEE ARTHROPLASTY     Both    Prior to Admission medications   Medication Sig Start Date End Date Taking? Authorizing Provider  allopurinol (ZYLOPRIM) 100 MG tablet Take 150 mg by mouth daily.    Yes [provider]  amLODipine (NORVASC) 5 MG tablet Take 2.5 mg by mouth daily.   Yes [provider]  aspirin EC 81 MG tablet Take 81 mg by mouth daily.   Yes [provider]  atorvastatin (LIPITOR) 40 MG tablet Take 40 mg by mouth at  bedtime.   Yes [provider]  benzonatate (TESSALON) 100 MG capsule Take 100 mg by mouth 3 (three) times daily.   Yes [provider]  carvedilol (COREG) 12.5 MG tablet Take 12.5 mg by mouth 2 (two) times daily with a meal.   Yes [provider]  chlorthalidone (HYGROTON) 25 MG tablet Take 37.5 mg by mouth daily.   Yes [provider]  docusate sodium (COLACE) 100 MG capsule Take 100 mg by mouth daily.   Yes [provider]  gabapentin (NEURONTIN) 300 MG capsule Take 300 mg by mouth 2 (two) times daily.   Yes [provider]  ibuprofen (ADVIL,MOTRIN) 400 MG tablet Take 400 mg by mouth every 8 (eight) hours as needed for mild pain.   Yes [provider]  lisinopril (PRINIVIL,ZESTRIL) 40 MG tablet Take 20 mg by mouth daily.    Yes [provider]  pantoprazole (PROTONIX) 40 MG tablet Take 40 mg by mouth daily.   Yes [provider]   sertraline (ZOLOFT) 100 MG tablet Take 200 mg by mouth daily.    Yes [provider]  traZODone (DESYREL) 100 MG tablet Take 100-200 mg by mouth at bedtime as needed for sleep.    Yes [provider]    Scheduled Meds: . allopurinol  150 mg Oral Daily  . atorvastatin  40 mg Oral QHS  . benzonatate  100 mg Oral TID  . docusate sodium  100 mg Oral Daily  . gabapentin  300 mg Oral BID  . pantoprazole  40 mg Oral Daily  . sertraline  200 mg Oral Daily  . sodium chloride flush  3 mL Intravenous Q12H   Infusions:  PRN Meds: acetaminophen **OR** acetaminophen, traZODone   Allergies as of 02/27/2019 - Review Complete 02/27/2019  Allergen Reaction Noted  . Linezolid Other (See Comments) 07/30/2012    Family History  Problem Relation Age of Onset  . Stroke Mother   . COPD Father   . Arthritis Sister     Social History   Socioeconomic History  . Marital status: Married    Spouse name: Not on file  . Number of children: 2  . Years of education: Not on file  . Highest education level: Not on file  Occupational History  . 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 Financial resource strain: Not on file  . Food insecurity:    Worry: Not on file    Inability: Not on file  . Transportation needs:    Medical: Not on file    Non-medical: Not on file  Tobacco Use  . Smoking status: Former Research scientist (life sciences)  . Smokeless tobacco: Never Used  Substance and Sexual Activity  . Alcohol use: Yes    Comment: Occasional  . Drug use: Never  . Sexual activity: Not on file  Lifestyle  . Physical activity:    Days per week: Not on file    Minutes per session: Not on file  . Stress: Not on file  Relationships  . Social connections:    Talks on phone: Not on file    Gets together: Not on file    Attends religious service: Not on file    Active member of club or organization: Not on file    Attends meetings of clubs or organizations: Not  on file    Relationship status: Not on file  . Intimate partner violence:    Fear of  current or ex partner: Not on file    Emotionally abused: Not on file    Physically abused: Not on file    Forced sexual activity: Not on file  Other Topics Concern  . Not on file  Social History Narrative  . Not on file    REVIEW OF SYSTEMS: Constitutional: Generally has good energy, no fatigue. ENT:  No nose bleeds recently but a few years back he had trouble with these. Pulm: Shortness of breath.  No cough. CV:  No palpitations, no LE edema.  Chest pain. GU:  No hematuria, no frequency GI: See HPI. Heme: No unusual or excessive bleeding or bruising. Transfusions: See HPI. Neuro: Syncope per HPI.  No headaches.  No dizziness, no seizures. Derm:  No itching, no rash or sores.  Endocrine:  No sweats or chills.  No polyuria or dysuria Immunization: Not queried. Travel:  None beyond local counties in last few months.    PHYSICAL EXAM: Vital signs in last 24 hours: Vitals:   02/28/19 0421 02/28/19 1113  BP: 130/69 (!) 165/84  Pulse: (!) 56 67  Resp: 18 (!) 22  Temp: 97.7 F (36.5 C) 97.7 F (36.5 C)  SpO2: 100% 100%   Wt Readings from Last 3 Encounters:  02/28/19 93.9 kg  02/27/18 83.9 kg  09/13/13 93.4 kg    General: Pleasant, calm, elderly, non-ill-appearing. Head: No facial asymmetry or swelling.  No signs of head trauma. Eyes: No scleral icterus.  No conjunctival pallor. Ears: Not hard of hearing Nose: No congestion or discharge. Mouth: Moist, clear, pink oral mucosa.  Tongue midline. Neck: No JVD, no masses, no thyromegaly. Lungs: No labored breathing or cough.  Lungs clear bilaterally. Heart: RRR.  Sinus rhythm in the 60s on telemetry monitor. Abdomen: Soft.  Not tender or distended.  ? hepatomegaly versus firm musculature..   Rectal: Not performed.  Stool submitted to the lab was not melenic but was FOBT positive. Musc/Skeltl: Postsurgical deformities and scars in both  knees, especially notable on the right knee. Extremities: No CCE. Neurologic: Appropriate.  Does not remember a lot of details regarding his medical history.  Oriented to person place time.  Follows commands.  No tremors, no limb weakness. Skin: Hyperpigmentation on the skin of the face. Nodes: No cervical adenopathy. Psych: Cooperative, calm, pleasant.  Intake/Output from previous day: 04/08 0701 - 04/09 0700 In: 500 [P.O.:250; I.V.:250] Out: 1500 [Urine:1500] Intake/Output this shift: No intake/output data recorded.  LAB RESULTS: Recent Labs    02/27/19 1944 02/28/19 0508  WBC 5.4 6.7  HGB 9.8* 9.4*  HCT 32.0* 30.5*  PLT 110* 108*   BMET Lab Results  Component Value Date   NA 140 02/28/2019   NA 137 02/27/2019   NA 136 02/27/2018   K 4.3 02/28/2019   K 4.5 02/27/2019   K 5.8 (H) 02/27/2018   CL 110 02/28/2019   CL 107 02/27/2019   CL 109 02/27/2018   CO2 22 02/28/2019   CO2 20 (L) 02/27/2019   CO2 17 (L) 02/27/2018   GLUCOSE 88 02/28/2019   GLUCOSE 112 (H) 02/27/2019   GLUCOSE 57 (L) 02/27/2018   BUN 31 (H) 02/28/2019   BUN 34 (H) 02/27/2019   BUN 41 (H) 02/27/2018   CREATININE 1.78 (H) 02/28/2019   CREATININE 2.17 (H) 02/27/2019   CREATININE 1.80 (H) 02/27/2018   CALCIUM 9.4 02/28/2019   CALCIUM 9.3 02/27/2019   CALCIUM 10.2 02/27/2018   LFT Recent Labs    02/27/19 1944  PROT 7.2  ALBUMIN 3.9  AST 26  ALT 22  ALKPHOS 64  BILITOT 0.3  BILIDIR <0.1  IBILI NOT CALCULATED   PT/INR Lab Results  Component Value Date   INR 1.03 07/30/2012   INR 1.45 11/09/2010   INR 1.00 11/02/2010   Hepatitis Panel No results for input(s): HEPBSAG, HCVAB, HEPAIGM, HEPBIGM in the last 72 hours. C-Diff No components found for: CDIFF Lipase     Component Value Date/Time   LIPASE 66 (H) 02/27/2018 1318    Drugs of Abuse     Component Value Date/Time   LABOPIA NONE DETECTED 02/27/2019 2100   COCAINSCRNUR NONE DETECTED 02/27/2019 2100   LABBENZ NONE  DETECTED 02/27/2019 2100   AMPHETMU NONE DETECTED 02/27/2019 2100   THCU POSITIVE (A) 02/27/2019 2100   LABBARB NONE DETECTED 02/27/2019 2100     RADIOLOGY STUDIES: Ct Head Wo Contrast  Result Date: 02/27/2019 CLINICAL DATA:  Syncope. EXAM: CT HEAD WITHOUT CONTRAST TECHNIQUE: Contiguous axial images were obtained from the base of the skull through the vertex without intravenous contrast. COMPARISON:  None. FINDINGS: Brain: No intracranial hemorrhage, mass effect, or midline shift. Generalized atrophy may be slightly advanced for age. Mild chronic small vessel ischemia. Remote lacunar infarcts in the bilateral basal ganglia. No hydrocephalus. The basilar cisterns are patent. No evidence of territorial infarct or acute ischemia. No extra-axial or intracranial fluid collection. Vascular: Atherosclerosis of skullbase vasculature without hyperdense vessel or abnormal calcification. Skull: No fracture or focal lesion. Sinuses/Orbits: Small mucous retention cyst in the left maxillary sinus. No acute findings. Other: None. IMPRESSION: 1. No acute intracranial abnormality. 2. Mild atrophy and chronic small vessel ischemia. 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 Electronically Signed   By: Keith Rake M.D.   On: 02/27/2019 20:56   Dg Chest Port 1 View  Result Date: 02/27/2019 CLINICAL DATA:  Initial evaluation for acute syncope. EXAM: PORTABLE CHEST 1 VIEW COMPARISON:  Prior radiograph from 02/27/2018 FINDINGS: Cardiac and mediastinal silhouettes are stable in size and contour, and remain within normal limits. Lungs normally inflated. Mild streaky left basilar subsegmental atelectasis. No focal infiltrates. No pulmonary edema or pleural effusion. No pneumothorax. No acute osseous finding. IMPRESSION: 1. Mild left basilar subsegmental atelectasis. 2. No other active cardiopulmonary disease. Electronically Signed   By: Jeannine Boga M.D.   On: 02/27/2019 20:28   Vas US Carotid  Result Date:  02/28/2019 Carotid Arterial Duplex Study    Summary: Right Carotid: Velocities in the right ICA are consistent with a 60-79%                stenosis. Left Carotid: Velocities in the left ICA are consistent with a 1-39% stenosis. Vertebrals: Bilateral vertebral arteries demonstrate antegrade flow. *See table(s) above for measurements and observations.     Preliminary       IMPRESSION:   *    Anemia, not at a level where he requires blood transfusion..   FOBT+.  Apparently has history of same several years ago and possibly had AVMs.  Recent baseline hemoglobins for comparison not available. Ever he has a history of hepatitis C which his wife says was eradicated.  Ultrasound from 2016 with ?  Course liver texture, has thrombocytopenia, so he could possibly have cirrhosis and portal hypertension vs varices. He is not currently hemorrhaging.     *    Chronic Xarelto for atrial fibrillation.  Previous cardiac ablation.  NSR currently. 2D echo today shows EF 55 to 60%.  Significant valvular  issues.   PLAN:     *    Obtain ultrasound of the abdomen to rule out cirrhosis given his risk factors.  *    Okay to continue eating.  Just scarfed down a tray of solid food.  *    ? EGD/Colonoscopy?     *   coags with next labs.    Azucena Freed  02/28/2019, 12:44 PM Phone [phone]

## 2019-02-28 NOTE — Consult Note (Addendum)
Hospital Consult    Reason for Consult:  Right carotid artery stenosis with syncope Requesting Physician:  Dr. Lupita Leash MRN #:  [id number]  History of Present Illness: This is a 73 y.o. male who presented to the ED last night with an episode of passing out.  He states this has never happened to him before.  His family found him unresponsive, per EMS, he received a 250cc bolus and was hemodynamically stable upon arrival.  He denies any dizziness, lightheadedness, chest pain or shortness of breath.  He has not had any chest pain or fevers.  He states he was a long time smoker but quit in the past 5-10 years.  He started smoking around 1967.  He does smoke marijuana.   He has hx of multiple back and knee surgeries.  He has hx of Hepatitis C, but apparently was treated with meds in the past.  Hx of thrombocytopenia and platelet count today is 108k.  He does have renal insufficiency with creatinine of 1.78 today (was 2.07 a year ago).  He has hx of atrial flutter with ablation in the past year at the [facility] in [place].   Pt is on Xarelto PTA.   He did have an carotid duplex that revealed 60-79% right ICA stenosis and 1-39% left ICA stenosis.  He denies having any temporary blindness, speech difficulties, numbness, weakness or paralysis of any extremity.  His mother had hx of stroke.   GI consult has been obtained.  They are getting u/s of abdomen to r/o cirrhosis and ordering coags with next labs.  May need EGD/colonoscopy.    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    The pt is on a statin for cholesterol management.  The pt is on a daily aspirin (he tells me he does not take this) Other AC:  Xarelto  The pt is on BB, ACEI, and CCB for hypertension.   The pt is not diabetic.   Tobacco hx:  Remote but current marijuana  Past Medical History:  Diagnosis Date  . Atrial flutter ([place])   . Colon polyp   . Gout   . Hepatitis C   . Hx of echocardiogram    Echo 9/13:  mod to severe LVH, EF 70%, no MV  SAM, no LVOT gradient, slight intracavitary LV gradient, mod BAE, mild TR, PASP 33  . Hypertension   . Microcytic anemia    colo at [facility] 2012 "ok" per patient  . Peripheral neuropathy   . Thrombocytopenia ([place])    evaluated by Heme in past     Past Surgical History:  Procedure Laterality Date  . BACK SURGERY    . CERVICAL DISC SURGERY    . REVISION TOTAL KNEE ARTHROPLASTY     Both    Allergies  Allergen Reactions  . Linezolid Other (See Comments)    unknown    Prior to Admission medications   Medication Sig Start Date End Date Taking? Authorizing Provider  allopurinol (ZYLOPRIM) 100 MG tablet Take 150 mg by mouth daily.    Yes [provider]  amLODipine (NORVASC) 5 MG tablet Take 2.5 mg by mouth daily.   Yes [provider]  aspirin EC 81 MG tablet Take 81 mg by mouth daily.   Yes [provider]  atorvastatin (LIPITOR) 40 MG tablet Take 40 mg by mouth at bedtime.   Yes [provider]  benzonatate (TESSALON) 100 MG capsule Take 100 mg by mouth 3 (three) times daily.  Yes [provider]  carvedilol (COREG) 12.5 MG tablet Take 12.5 mg by mouth 2 (two) times daily with a meal.   Yes [provider]  chlorthalidone (HYGROTON) 25 MG tablet Take 37.5 mg by mouth daily.   Yes [provider]  docusate sodium (COLACE) 100 MG capsule Take 100 mg by mouth daily.   Yes [provider]  gabapentin (NEURONTIN) 300 MG capsule Take 300 mg by mouth 2 (two) times daily.   Yes [provider]  ibuprofen (ADVIL,MOTRIN) 400 MG tablet Take 400 mg by mouth every 8 (eight) hours as needed for mild pain.   Yes [provider]  lisinopril (PRINIVIL,ZESTRIL) 40 MG tablet Take 20 mg by mouth daily.    Yes [provider]  pantoprazole (PROTONIX) 40 MG tablet Take 40 mg by mouth daily.   Yes [provider]  sertraline (ZOLOFT) 100 MG tablet Take 200 mg by mouth daily.    Yes [provider]  traZODone (DESYREL) 100 MG tablet Take 100-200 mg by mouth at bedtime as needed for sleep.    Yes [provider]    Social History   Socioeconomic History  . Marital status: Married    Spouse name: Not on file  . Number of children: 2  . Years of education: Not on file  . Highest education level: Not on file  Occupational History  . 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 Financial resource strain: Not on file  . Food insecurity:    Worry: Not on file    Inability: Not on file  . Transportation needs:    Medical: Not on file    Non-medical: Not on file  Tobacco Use  . Smoking status: Former Research scientist (life sciences)  . Smokeless tobacco: Never Used  Substance and Sexual Activity  . Alcohol use: Yes    Comment: Occasional  . Drug use: Never  . Sexual activity: Not on file  Lifestyle  . Physical activity:    Days per week: Not on file    Minutes per session: Not on file  . Stress: Not on file  Relationships  . Social connections:    Talks on phone: Not on file    Gets together: Not on file    Attends religious service: Not on file    Active member of club or organization: Not on file    Attends meetings of clubs or organizations: Not on file    Relationship status: Not on file  . Intimate partner violence:    Fear of current or ex partner: Not on file    Emotionally abused: Not on file    Physically abused: Not on file    Forced sexual activity: Not on file  Other Topics Concern  . Not on file  Social History Narrative  . Not on file     Family History  Problem Relation Age of Onset  . Stroke Mother   . COPD Father   . Arthritis Sister     ROS: [x]  Positive   [ ]  Negative   [ ]  All sytems reviewed and are negative  Cardiac: []  chest pain/pressure []  palpitations []  SOB lying flat []  DOE  Vascular: []  pain in legs while walking [x]  tenderness on the bottom of the left foot  []  pain in legs at night  []  non-healing ulcers []  hx of DVT []  swelling in legs  Pulmonary: []  productive cough []   asthma/wheezing []  home O2  Neurologic: []  weakness in []  arms []  legs []  numbness in []  arms []  legs []  hx of CVA []  mini stroke [] difficulty speaking or slurred speech []  temporary loss of vision in one eye []  dizziness  Hematologic: []  hx of cancer []  bleeding problems []  problems with blood clotting easily  Endocrine:   []  diabetes []  thyroid disease  GI []  vomiting blood []  blood in stool  GU: []  CKD/renal failure []  HD--[]  M/W/F or []  T/T/S []  burning with urination []  blood in urine  Psychiatric: []  anxiety []  depression  Musculoskeletal: []  arthritis []  joint pain  Integumentary: []  rashes []  ulcers  Constitutional: []  fever []  chills   Physical Examination  Vitals:   02/28/19 0421 02/28/19 1113  BP: 130/69 (!) 165/84  Pulse: (!) 56 67  Resp: 18 (!) 22  Temp: 97.7 F (36.5 C) 97.7 F (36.5 C)  SpO2: 100% 100%   Body mass index is 27.31 kg/m.  General:  WDWN in NAD Gait: Not observed HENT: WNL, normocephalic Pulmonary: normal non-labored breathing, without Rales, rhonchi,  wheezing Cardiac: regularwithout carotid bruits Abdomen:  soft, NT/ND, no pulsatile masses palpated Skin: without rashes; scars bilateral knees Vascular Exam/Pulses:  Right Left  Radial 2+ (normal) 2+ (normal)  Ulnar Unable to palpate  Unable to palpate   Femoral 2+ (normal) 2+ (normal)  Popliteal Unable to palpate  2+ (normal)  DP Unable to palpate  Unable to palpate   PT Unable to palpate  Unable to palpate    Extremities: without ischemic changes, without Gangrene , without cellulitis; without open wounds; tenderness plantar aspect of left foot.  Musculoskeletal: no muscle wasting or atrophy  Neurologic: A&O X 3;  No focal weakness or paresthesias are detected; speech is fluent/normal Psychiatric:  The pt has Normal affect.   CBC    Component Value  Date/Time   WBC 6.7 02/28/2019 0508   RBC 3.67 (L) 02/28/2019 0508   HGB 9.4 (L) 02/28/2019 0508   HGB 12.0 (L) 02/24/2011 1414   HCT 30.5 (L) 02/28/2019 0508   HCT 35.8 (L) 02/24/2011 1414   PLT 108 (L) 02/28/2019 0508   PLT 124 (L) 02/24/2011 1414   MCV 83.1 02/28/2019 0508   MCV 77.3 (L) 02/24/2011 1414   MCH 25.6 (L) 02/28/2019 0508   MCHC 30.8 02/28/2019 0508   RDW 15.9 (H) 02/28/2019 0508   RDW 15.2 (H) 02/24/2011 1414   LYMPHSABS 1.2 02/27/2019 1944   LYMPHSABS 2.2 02/24/2011 1414   MONOABS 0.5 02/27/2019 1944   MONOABS 0.7 02/24/2011 1414   EOSABS 0.2 02/27/2019 1944   EOSABS 0.0 02/24/2011 1414   BASOSABS 0.0 02/27/2019 1944   BASOSABS 0.0 02/24/2011 1414    BMET    Component Value Date/Time   NA 140 02/28/2019 0508   K 4.3 02/28/2019 0508   CL 110 02/28/2019 0508   CO2 22 02/28/2019 0508   GLUCOSE 88 02/28/2019 0508   BUN 31 (H) 02/28/2019 0508   CREATININE 1.78 (H) 02/28/2019 0508   CALCIUM 9.4 02/28/2019 0508   GFRNONAA 37 (L) 02/28/2019 0508   GFRAA 43 (L) 02/28/2019 0508    COAGS: Lab Results  Component Value Date   INR 1.03 07/30/2012   INR 1.45 11/09/2010   INR 1.00 11/02/2010     Non-Invasive Vascular Imaging:   Carotid duplex 02/28/2019: Result Date: 02/28/2019 Carotid Arterial Duplex Study    Summary: Right Carotid: Velocities in the right ICA are consistent with a 60-79%  stenosis. Left Carotid: Velocities in the left ICA are consistent with a 1-39% stenosis. Vertebrals: Bilateral vertebral arteries demonstrate antegrade flow  2D echocardiogram 02/28/2019: IMPRESSIONS  1. The left ventricle has normal systolic function, with an ejection fraction of 55-60%. The cavity size was normal. There is mildly increased left ventricular wall thickness. Left ventricular diastolic parameters were normal.  2. Mildly decreased GLS -14.2.  3. The right ventricle has normal systolic function. The cavity was normal. There is no increase in right  ventricular wall thickness.  4. Left atrial size was mildly dilated.  5. Mild thickening of the mitral valve leaflet. Mild calcification of the mitral valve leaflet. There is mild mitral annular calcification present.  6. The tricuspid valve is grossly normal.  7. The aortic valve was not well visualized. Moderate thickening of the aortic valve. Severe calcifcation of the aortic valve. Aortic valve regurgitation is trivial by color flow Doppler.  8. There is mild dilatation of the aortic root measuring 39 mm.  EEG in process   ASSESSMENT/PLAN: This is a 73 y.o. male admitted with syncope  -pt found to have 60-79% right ICA stenosis and 1-39% left ICA stenosis.  He has not had any amaurosis fugax, speech difficulties or numbness or hemiparesis.  His syncope is most likely not related to his carotid stenosis.  This will need to be followed by serial u/s in the future.  Would most likely not proceed with intervention until there is greater than 80% ICA stenosis or pt becomes symptomatic.   -continue statin and asa once okay with GI -pt with CKD III with creatinine of 1.8 -Dr. Carlis Abbott to see pt this afternoon.    Leontine Locket, PA-C [facility] [phone]  I have seen and evaluated the patient. I agree with the PA note as documented above. 73 yo M found down slumped in chair at home on porch.  No focal neurologic symptoms or lateralizing signs during or after event per patient and in review of medical record.  Found to have 60-79% stenosis R ICA and no significant stenosis on left.  Vascular consulted for carotid as source of possible syncope.  Reviewed with patient unlikely his carotids are etiology and normal carotid intervention is stroke prevention.  Given antegrade flow in both verts and no significant disease on left extremely unlikely related to carotids.  Discussed he will need surveillance and if progresses to >80% stenosis would offer intervention.  Will arrange  follow-up in 6 months with repeat carotid duplex.  Marty Heck, MD [facility] [place] Office: [phone] Pager: [place]

## 2019-02-28 NOTE — Progress Notes (Signed)
  Echocardiogram 2D Echocardiogram has been performed.  Clinton Thomas M 02/28/2019, 8:23 AM

## 2019-02-28 NOTE — Care Management Obs Status (Signed)
[facility] NOTIFICATION   Patient Details  Name: DAK SZUMSKI MRN: [id number] Date of Birth: [birth year]   Medicare Observation Status Notification Given:  Yes(Pt with early dementia, letter explained to pt/ spouse; all questions answered)    Royston Bake, RN 02/28/2019, 11:13 AM

## 2019-02-28 NOTE — Progress Notes (Signed)
   02/28/19 0900  Mobility  Activity Ambulated in hall;Ambulated to bathroom;Dangled on edge of bed  Range of Motion Active;All extremities  Level of Assistance Standby assist, set-up cues, supervision of patient - no hands on  Assistive Device [place];Other (Comment) (Uses cane at home, IV Pole used to ambulate in halllway)  Minutes Stood 5 minutes  Minutes Ambulated 5 minutes  Distance Ambulated (ft) 200 ft  Mobility Response Tolerated well;RN notified (c/o chronic pain in LLE d/t "callus" on bottom of his foot)  Bed Position Semi-fowlers   Per request of RN- Patient ambulated in hallway w/standby no hands-on assist, at home he is completely independent. No complaints of SOB, dizziness/lightheadedness, chest paint or pressure.   Evelise Reine's Mobility Criteria Algorithm Info.  Mobility Status: Ambulates independently: Yes, no lift needed  Fall Risk Score: No data recorded Assistive Device: Assistive Device: None  Transfer Equipment: No data recorded LDAs: No data recorded Level of Assistance: Level of Assistance: Standby assist, set-up cues, supervision of patient - no hands on  Fall History: No data recorded Fall Medications: No data recorded Altered Cognition: No data recorded Sensory Deficit: No data recorded

## 2019-02-28 NOTE — Procedures (Signed)
ELECTROENCEPHALOGRAM REPORT   Patient: Clinton Thomas       Room #: 9J18A EEG No. ID: [id number] Age: 73 y.o.        Sex: male Referring Physician: Kc Report Date:  02/28/2019        Interpreting Physician: Alexis Goodell  History: Clinton Thomas is an 73 y.o. male with syncope evaluated to rule out seizure  Medications:  Zyloprim, Lipitor, Colace, Neurontin, Protonix, Zoloft  Conditions of Recording:  This is a 21 channel routine scalp EEG performed with bipolar and monopolar montages arranged in accordance to the international 10/20 system of electrode placement. One channel was dedicated to EKG recording.  The patient is in the awake, drowsy and asleep states.  Description:  The waking background activity consists of a low voltage, symmetrical, fairly well organized, 10 Hz alpha activity, seen from the parieto-occipital and posterior temporal regions.  Low voltage fast activity, poorly organized, is seen anteriorly and is at times superimposed on more posterior regions.  A mixture of theta and alpha rhythms are seen from the central and temporal regions. The patient drowses with slowing to irregular, low voltage theta and beta activity.   The patient goes in to a light sleep with symmetrical sleep spindles, vertex central sharp transients and irregular slow activity.  No epileptiform activity is noted. Hyperventilation and intermittent photic stimulation were not performed.  IMPRESSION: Normal electroencephalogram, awake and asleep. There are no focal lateralizing or epileptiform features.   Alexis Goodell, MD Neurology [phone] 02/28/2019, 3:54 PM

## 2019-02-28 NOTE — Progress Notes (Signed)
EEG complete - results pending 

## 2019-03-01 ENCOUNTER — Observation Stay (HOSPITAL_COMMUNITY): Payer: Medicare HMO

## 2019-03-01 DIAGNOSIS — D649 Anemia, unspecified: Secondary | ICD-10-CM | POA: Diagnosis not present

## 2019-03-01 DIAGNOSIS — R195 Other fecal abnormalities: Secondary | ICD-10-CM

## 2019-03-01 DIAGNOSIS — R932 Abnormal findings on diagnostic imaging of liver and biliary tract: Secondary | ICD-10-CM | POA: Diagnosis not present

## 2019-03-01 LAB — PROTIME-INR
INR: 1.1 (ref 0.8–1.2)
Prothrombin Time: 14 seconds (ref 11.4–15.2)

## 2019-03-01 LAB — CBC
HCT: 32 % — ABNORMAL LOW (ref 39.0–52.0)
Hemoglobin: 10.3 g/dL — ABNORMAL LOW (ref 13.0–17.0)
MCH: 26.5 pg (ref 26.0–34.0)
MCHC: 32.2 g/dL (ref 30.0–36.0)
MCV: 82.3 fL (ref 80.0–100.0)
Platelets: 108 10*3/uL — ABNORMAL LOW (ref 150–400)
RBC: 3.89 MIL/uL — ABNORMAL LOW (ref 4.22–5.81)
RDW: 15.9 % — ABNORMAL HIGH (ref 11.5–15.5)
WBC: 5.6 10*3/uL (ref 4.0–10.5)
nRBC: 0 % (ref 0.0–0.2)

## 2019-03-01 LAB — URINE CULTURE: Culture: <10000 — AB

## 2019-03-01 LAB — GLUCOSE, CAPILLARY: Glucose-Capillary: 69 mg/dL — ABNORMAL LOW (ref 70–99)

## 2019-03-01 MED ORDER — CHLORTHALIDONE 25 MG PO TABS
25.0000 mg | ORAL_TABLET | Freq: Every day | ORAL | Status: DC
  Administered 2019-03-01: 13:00:00 25 mg via ORAL
  Filled 2019-03-01: qty 1

## 2019-03-01 MED ORDER — CARVEDILOL 6.25 MG PO TABS
6.2500 mg | ORAL_TABLET | Freq: Two times a day (BID) | ORAL | 0 refills | Status: AC
Start: 2019-03-01 — End: 2024-10-14

## 2019-03-01 MED ORDER — CHLORTHALIDONE 25 MG PO TABS
25.0000 mg | ORAL_TABLET | Freq: Every day | ORAL | 0 refills | Status: AC
Start: 2019-03-01 — End: 2024-10-14

## 2019-03-01 MED ORDER — CARVEDILOL 6.25 MG PO TABS
6.2500 mg | ORAL_TABLET | Freq: Two times a day (BID) | ORAL | Status: DC
  Administered 2019-03-01: 13:00:00 6.25 mg via ORAL
  Filled 2019-03-01: qty 1

## 2019-03-01 MED ORDER — GLUCOSE 40 % PO GEL
ORAL | Status: AC
  Administered 2019-03-01: 07:00:00 37.5 g
  Filled 2019-03-01: qty 1

## 2019-03-01 NOTE — Progress Notes (Signed)
Discussed medications and follow up appointment with patient.  Talk to wife about change in medications.  Sent instruction home.

## 2019-03-01 NOTE — Discharge Summary (Signed)
Physician Discharge Summary  FULTON MERRY ZOX:[id number] DOB: 1946-10-17 DOA: 02/27/2019  PCP: System, Provider Not In  Admit date: 02/27/2019 Discharge date: 03/01/2019  Admitted From: Home Disposition: Home  Recommendations for Outpatient Follow-up:  1. Follow up with PCP and cardiology in 1-2 weeks 2. Please follow-up with gastroenterology at [facility],  obtain CBC in 2 week  Home Health:none Equipment/Devices:none Discharge Condition: Stable CODE STATUS: Full code Diet recommendation: Cardiac  Brief/Interim Summary: 73 y.o.malewith medical history significant ofatrial flutter, hypertension, hepatitis C, gout, colon polyp presenting to the hospital via EMS for evaluation of loss of consciousness. Per EMS report, family found the patient slumped over in a chair, unresponsive, blue in the face, and not breathing well. Patient was found to be possibly hypotensive with EMS and was given 250 cc fluid bolus.Hemodynamically stable upon arrival. Patient states he was in his usual state of health and sweeping his porch today and then just remembers waking up on the ground with his family around him. Denies any dizziness/lightheadedness, chest pain, or shortness of breath prior to the syncopal episode. Denies any recent illness, fevers, chills, chest pain, shortness of breath, rhinorrhea, sore throat, abdominal pain, nausea, vomiting, diarrhea, dysuria, or urinary frequency/urgency. Reports smoking marijuana chronicallybut denies any other drug use. Denies alcohol use. 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 He is also a former smoker. No change in his chronic cough and it has not been bothering him recently. Denies any recent travel, sick contacts, or exposure to any individual with confirmed COVID-19. Reports having dark coloredstools sometimes.No hematochezia. Denies any history of seizures.[facility]  ablation done for his atrial flutter at the [place] within the past year. He was seen in ER and admitted for further work up as below.  Assessment & Plan:   Syncope: no significant findings on tele. Echocardiogram nl LVEF and no significant findings Carotid  Doppler showed right ICA  with a 60-79%stenosis.negative orthostatic vitals.  EEG was negative. Has anemia but no acute blood loss and hb stable in 9-10gm.  Per note possibly hypotension for EMS needing 250 ml IV bolus, and this could explain his syncope and  suspect this is due to secondary to his multiple antihypertensive regimens which are on hold.  We will arrange for out currently as outpatient follow-up to see if he needs additional monitoring device given previous history of arrhythmia a flutter needing ablation last year at [facility].  Patient has a ambulating in the hallway without any issues.  He feels ready for discharge home today.  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  Seen by vascular surgery and they will plan to follow-up outpatient.  Vascular evaluation.Resume asa once okay with GI.  Anemia, recent hb was 15.3 gm a year ago FOBT is positive but hb stable at 9.4-9.8 gm and no obvious bleeding. Last EGF/Colo in 2013 with 2 very superficial 1 mm clean-based erosions in the gastric body and erythema in the duodenal bulb negative for dysplasia or malignancy colonoscopy showed sessile polyp at the cecum that was removed. Iron panel  w normal ferritin.tibs and fe. Known to [facility] and was consulted and no plan for any procedures currently and [facility].  His hemoglobin appears improved in 10  g today.  His last stool was brown yesterday. Was given iron. F/u with PCP for cbc check and iron supplement.   Possible liver cirrhosis-f/u with GI at [place].  CKD  III, Previous creatinine was 1.8-2.0 in 2019, creatinine 2.1 has improved 1.7 with IV fluid hydration. His UA normal.  UDS + for THC.  KKX:FGHWE pressure now 160s persistently, His blood pressure is creeping up- resume his chlorthalidone and Coreg at lower dose but continue to hold lisinopril and amlodipine.  Advised to monitor blood pressure at home and hold medication if blood pressure below 130s or 120s and follow-up with his PCP to adjust medication.   Hx of A Flutter-no taking anticoagulation currently.as per the wife patient had ablation procedure at [facility] and he was taken off anticoagulation after that.  He is on aspirin and okay to continue aspirin as per the GI.  HR stable. Hx of thrombocytopenia : Platelet count low but stable   Discharge Diagnoses:  Principal Problem:   Syncope Active Problems:   Atrial flutter (HCC)   Anemia   Hx of thrombocytopenia   CKD (chronic kidney disease) stage 3, GFR 30-59 ml/min [facility])    Discharge Instructions  Discharge Instructions    Call MD for:   Complete by:  As directed    Monitor BP at home and hold BP meds if BP less than 120 and contact your MD. Follow-up with [facility] gastroenterology regarding for need for further endoscope.  Close colonoscopy   Diet - low sodium heart healthy   Complete by:  As directed    Increase activity slowly   Complete by:  As directed      Allergies as of 03/01/2019      Reactions   Linezolid Other (See Comments)   unknown      Medication List    STOP taking these medications   amLODipine 5 MG tablet Commonly known as:  NORVASC   ibuprofen 400 MG tablet Commonly known as:  ADVIL,MOTRIN   lisinopril 40 MG tablet Commonly known as:  PRINIVIL,ZESTRIL     TAKE these medications   allopurinol 100 MG tablet Commonly known as:  ZYLOPRIM Take 150 mg by mouth daily.   aspirin EC 81 MG tablet Take 81 mg by mouth daily.   atorvastatin 40 MG tablet Commonly known as:  LIPITOR Take 40 mg by  mouth at bedtime.   benzonatate 100 MG capsule Commonly known as:  TESSALON Take 100 mg by mouth 3 (three) times daily.   carvedilol 6.25 MG tablet Commonly known as:  COREG Take 1 tablet (6.25 mg total) by mouth 2 (two) times daily with a meal for 30 days. What changed:    medication strength  how much to take   chlorthalidone 25 MG tablet Commonly known as:  HYGROTON Take 1 tablet (25 mg total) by mouth daily for 30 days. What changed:  how much to take   docusate sodium 100 MG capsule Commonly known as:  COLACE Take 100 mg by mouth daily.   gabapentin 300 MG capsule Commonly known as:  NEURONTIN Take 300 mg by mouth 2 (two) times daily.   pantoprazole 40 MG tablet Commonly known as:  PROTONIX Take 40 mg by mouth daily.   sertraline 100 MG tablet Commonly known as:  ZOLOFT Take 200 mg by mouth daily.   traZODone 100 MG tablet Commonly known as:  DESYREL Take 100-200 mg by mouth at bedtime as needed for sleep.      Follow-up Information  Center, [place]:  General Practice Why:  Please follow up  Contact information: [address]  [place] [address] [phone]          Allergies  Allergen Reactions  . Linezolid Other (See Comments)    unknown    Consultations:   Procedures/Studies: Ct Head Wo Contrast  Result Date: 02/27/2019 CLINICAL DATA:  Syncope. EXAM: CT HEAD WITHOUT CONTRAST TECHNIQUE: Contiguous axial images were obtained from the base of the skull through the vertex without intravenous contrast. COMPARISON:  None. FINDINGS: Brain: No intracranial hemorrhage, mass effect, or midline shift. Generalized atrophy may be slightly advanced for age. Mild chronic small vessel ischemia. Remote lacunar infarcts in the bilateral basal ganglia. No hydrocephalus. The basilar cisterns are patent. No evidence of territorial infarct or acute ischemia. No extra-axial or intracranial fluid collection. Vascular: Atherosclerosis of  skullbase vasculature without hyperdense vessel or abnormal calcification. Skull: No fracture or focal lesion. Sinuses/Orbits: Small mucous retention cyst in the left maxillary sinus. No acute findings. Other: None. IMPRESSION: 1. No acute intracranial abnormality. 2. Mild atrophy and chronic small vessel ischemia. 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 Electronically Signed   By: Keith Rake M.D.   On: 02/27/2019 20:56   US Abdomen Limited  Result Date: 03/01/2019 CLINICAL DATA:  Cirrhosis.  Hepatitis C. EXAM: ULTRASOUND ABDOMEN LIMITED RIGHT UPPER QUADRANT COMPARISON:  CT abdomen and pelvis 02/27/2018. Abdominal ultrasound 07/08/2015 and 08/10/2012. FINDINGS: Gallbladder: No generalized gallbladder wall thickening or shadowing gallstones. There are a few mildly echogenic foci in the gallbladder wall with suggestion of mild comet tail artifact, more prominent on the 2013 ultrasound and not well demonstrated on the 2016 study. No sonographic Murphy sign noted by sonographer. Common bile duct: Diameter: 4 mm Liver: Mildly coarsened echotexture and mildly nodular liver contour without a focal lesion identified. Portal vein is patent on color Doppler imaging with normal direction of blood flow towards the liver. IMPRESSION: 1. Coarse hepatic echotexture with mildly nodular contour which may reflect cirrhosis. No focal liver lesion identified. 2. Suspected tiny cholesterol polyps or adenomyomatosis involving the gallbladder, more prominent in 2013. Electronically Signed   By: Logan Bores M.D.   On: 03/01/2019 08:02   Dg Chest Port 1 View  Result Date: 02/27/2019 CLINICAL DATA:  Initial evaluation for acute syncope. EXAM: PORTABLE CHEST 1 VIEW COMPARISON:  Prior radiograph from 02/27/2018 FINDINGS: Cardiac and mediastinal silhouettes are stable in size and contour, and remain within normal limits. Lungs normally inflated. Mild streaky left basilar subsegmental atelectasis. No focal infiltrates. No  pulmonary edema or pleural effusion. No pneumothorax. No acute osseous finding. IMPRESSION: 1. Mild left basilar subsegmental atelectasis. 2. No other active cardiopulmonary disease. Electronically Signed   By: Jeannine Boga M.D.   On: 02/27/2019 20:28   Vas US Carotid  Result Date: 02/28/2019 Carotid Arterial Duplex Study Indications: Syncope. Performing Technologist: Abram Sander RVS  Examination Guidelines: A complete evaluation includes B-mode imaging, spectral Doppler, color Doppler, and power Doppler as needed of all accessible portions of each vessel. Bilateral testing is considered an integral part of a complete examination. Limited examinations for reoccurring indications may be performed as noted.  Right Carotid Findings: +----------+--------+--------+--------+-----------+--------+           PSV cm/sEDV cm/sStenosisDescribe   Comments +----------+--------+--------+--------+-----------+--------+ CCA Prox  88      13              homogeneous         +----------+--------+--------+--------+-----------+--------+ CCA  Distal66      17              homogeneous         +----------+--------+--------+--------+-----------+--------+ ICA Prox  194     82      60-79%  homogeneous         +----------+--------+--------+--------+-----------+--------+ ICA Mid   129     25                                  +----------+--------+--------+--------+-----------+--------+ ICA Distal75      27                                  +----------+--------+--------+--------+-----------+--------+ ECA       96      10                                  +----------+--------+--------+--------+-----------+--------+ +----------+--------+-------+--------+-------------------+           PSV cm/sEDV cmsDescribeArm Pressure (mmHG) +----------+--------+-------+--------+-------------------+ [id number]                                          +----------+--------+-------+--------+-------------------+ +---------+--------+--+--------+--+---------+ VertebralPSV cm/s77EDV cm/s21Antegrade +---------+--------+--+--------+--+---------+  Left Carotid Findings: +----------+--------+--------+--------+-----------+--------+           PSV cm/sEDV cm/sStenosisDescribe   Comments +----------+--------+--------+--------+-----------+--------+ CCA Prox  109     15              homogeneous         +----------+--------+--------+--------+-----------+--------+ CCA Distal53      11              homogeneous         +----------+--------+--------+--------+-----------+--------+ ICA Prox  79      22      1-39%   homogeneous         +----------+--------+--------+--------+-----------+--------+ ICA Distal116     34                                  +----------+--------+--------+--------+-----------+--------+ ECA       55                                          +----------+--------+--------+--------+-----------+--------+ +----------+--------+--------+--------+-------------------+ SubclavianPSV cm/sEDV cm/sDescribeArm Pressure (mmHG) +----------+--------+--------+--------+-------------------+           70                                          +----------+--------+--------+--------+-------------------+ +---------+--------+--+--------+--+---------+ VertebralPSV cm/s50EDV cm/s17Antegrade +---------+--------+--+--------+--+---------+  Summary: Right Carotid: Velocities in the right ICA are consistent with a 60-79%                stenosis. Left Carotid: Velocities in the left ICA are consistent with a 1-39% stenosis. Vertebrals: Bilateral vertebral arteries demonstrate antegrade flow. *See table(s) above for measurements and observations.  Electronically signed by Monica Martinez MD on 02/28/2019 at 52:19:38 PM.    Final  Subjective: Resting well ambulating.  No new complaints.  Feels ready go home today  Discharge  Exam: Vitals:   03/01/19 0443 03/01/19 1037  BP: (!) 149/82 (!) 163/87  Pulse: 65 65  Resp: 18   Temp: (!) 97.4 F (36.3 C)   SpO2: 100% 100%   Vitals:   02/28/19 1113 02/28/19 2109 03/01/19 0443 03/01/19 1037  BP: (!) 165/84 (!) 163/88 (!) 149/82 (!) 163/87  Pulse: 67 64 65 65  Resp: (!) 22 18 18    Temp: 97.7 F (36.5 C) (!) 97.5 F (36.4 C) (!) 97.4 F (36.3 C)   TempSrc: Oral Oral Oral   SpO2: 100% 99% 100% 100%  Weight:   92.8 kg   Height:       General: Pt is alert, awake, not in acute distress Cardiovascular: RRR, S1/S2 +, no rubs, no gallops Respiratory: CTA bilaterally, no wheezing, no rhonchi Abdominal: Soft, NT, ND, bowel sounds + Extremities: no edema, no cyanosis  The results of significant diagnostics from this hospitalization (including imaging, microbiology, ancillary and laboratory) are listed below for reference.     Microbiology: No results found for this or any previous visit (from the past 240 hour(s)).   Labs: BNP (last 3 results) No results for input(s): BNP in the last 8760 hours. Basic Metabolic Panel: Recent Labs  Lab 02/27/19 1944 02/28/19 0508  NA 137 140  K 4.5 4.3  CL 107 110  CO2 20* 22  GLUCOSE 112* 88  BUN 34* 31*  CREATININE 2.17* 1.78*  CALCIUM 9.3 9.4  MG 2.1  --    Liver Function Tests: Recent Labs  Lab 02/27/19 1944  AST 26  ALT 22  ALKPHOS 64  BILITOT 0.3  PROT 7.2  ALBUMIN 3.9   No results for input(s): LIPASE, AMYLASE in the last 168 hours. No results for input(s): AMMONIA in the last 168 hours. CBC: Recent Labs  Lab 02/27/19 1944 02/28/19 0508 03/01/19 0502  WBC 5.4 6.7 5.6  NEUTROABS 3.5  --   --   HGB 9.8* 9.4* 10.3*  HCT 32.0* 30.5* 32.0*  MCV 84.7 83.1 82.3  PLT 110* 108* 108*   Cardiac Enzymes: Recent Labs  Lab 02/27/19 1944  TROPONINI <0.03   BNP: Invalid input(s): POCBNP CBG: Recent Labs  Lab 02/27/19 2001 02/28/19 0641 03/01/19 0644  GLUCAP 106* 73 69*   D-Dimer No results  for input(s): DDIMER in the last 72 hours. Hgb A1c No results for input(s): HGBA1C in the last 72 hours. Lipid Profile No results for input(s): CHOL, HDL, LDLCALC, TRIG, CHOLHDL, LDLDIRECT in the last 72 hours. Thyroid function studies No results for input(s): TSH, T4TOTAL, T3FREE, THYROIDAB in the last 72 hours.  Invalid input(s): FREET3 Anemia work up Recent Labs    02/28/19 0508  FERRITIN 57  TIBC 322  IRON 65   Urinalysis    Component Value Date/Time   COLORURINE YELLOW 02/27/2019 2100   APPEARANCEUR CLEAR 02/27/2019 2100   LABSPEC 1.012 02/27/2019 2100   [place] 5.0 02/27/2019 2100   [place] NEGATIVE 02/27/2019 2100   [place] NEGATIVE 02/27/2019 2100   [place] NEGATIVE 02/27/2019 2100   [place] NEGATIVE 02/27/2019 2100   PROTEINUR NEGATIVE 02/27/2019 2100   UROBILINOGEN 1.0 09/10/2013 2140   NITRITE NEGATIVE 02/27/2019 2100   LEUKOCYTESUR TRACE (A) 02/27/2019 2100   Sepsis Labs Invalid input(s): PROCALCITONIN,  WBC,  LACTICIDVEN Microbiology No results found for this or any previous visit (from the past 240 hour(s)).   Time coordinating discharge:  84minutes  SIGNED:   Antonieta Pert, MD  [facility] 03/01/2019, 11:23 AM  If 7PM-7AM, please contact night-coverage [email or web address]

## 2019-03-01 NOTE — Progress Notes (Addendum)
Daily Rounding Note  03/01/2019, 8:30 AM  LOS: 0 days   SUBJECTIVE:   Chief complaint:  FOBT +, non-critical anemia.     Pt feeling well.  Stool brown yesterday.     OBJECTIVE:         Vital signs in last 24 hours:    Temp:  [97.4 F (36.3 C)-97.7 F (36.5 C)] 97.4 F (36.3 C) (04/10 0443) Pulse Rate:  [64-67] 65 (04/10 0443) Resp:  [18-22] 18 (04/10 0443) BP: (149-165)/(82-88) 149/82 (04/10 0443) SpO2:  [99 %-100 %] 100 % (04/10 0443) Weight:  [92.8 kg] 92.8 kg (04/10 0443) Last BM Date: 02/27/19 Filed Weights   02/27/19 2308 02/28/19 0421 03/01/19 0443  Weight: 94.1 kg 93.9 kg 92.8 kg   General: very pleasant, animated.     Heart: RRR.  NSR in 60s on tele Chest:  Abdomen: clear bil.  No labored breathing or cough.    Extremities: no CCE.   Neuro/Psych:  Oriented to his year of birth.  Recalls his experiences in [place] well. No gross deficits.  No tremors.     Intake/Output from previous day: 04/09 0701 - 04/10 0700 In: 600 [P.O.:600] Out: 1350 [Urine:1350]  Intake/Output this shift: No intake/output data recorded.  Lab Results: Recent Labs    02/27/19 1944 02/28/19 0508 03/01/19 0502  WBC 5.4 6.7 5.6  HGB 9.8* 9.4* 10.3*  HCT 32.0* 30.5* 32.0*  PLT 110* 108* 108*   BMET Recent Labs    02/27/19 1944 02/28/19 0508  NA 137 140  K 4.5 4.3  CL 107 110  CO2 20* 22  GLUCOSE 112* 88  BUN 34* 31*  CREATININE 2.17* 1.78*  CALCIUM 9.3 9.4   LFT Recent Labs    02/27/19 1944  PROT 7.2  ALBUMIN 3.9  AST 26  ALT 22  ALKPHOS 64  BILITOT 0.3  BILIDIR <0.1  IBILI NOT CALCULATED   PT/INR Recent Labs    03/01/19 0502  LABPROT 14.0  INR 1.1   Hepatitis Panel No results for input(s): HEPBSAG, HCVAB, HEPAIGM, HEPBIGM in the last 72 hours.  Studies/Results: Ct Head Wo Contrast  Result Date: 02/27/2019 CLINICAL DATA:  Syncope. EXAM: CT HEAD WITHOUT CONTRAST TECHNIQUE: Contiguous axial  images were obtained from the base of the skull through the vertex without intravenous contrast. COMPARISON:  None. FINDINGS: Brain: No intracranial hemorrhage, mass effect, or midline shift. Generalized atrophy may be slightly advanced for age. Mild chronic small vessel ischemia. Remote lacunar infarcts in the bilateral basal ganglia. No hydrocephalus. The basilar cisterns are patent. No evidence of territorial infarct or acute ischemia. No extra-axial or intracranial fluid collection. Vascular: Atherosclerosis of skullbase vasculature without hyperdense vessel or abnormal calcification. Skull: No fracture or focal lesion. Sinuses/Orbits: Small mucous retention cyst in the left maxillary sinus. No acute findings. Other: None. IMPRESSION: 1. No acute intracranial abnormality. 2. Mild atrophy and chronic small vessel ischemia. 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 Electronically Signed   By: Keith Rake M.D.   On: 02/27/2019 20:56   US Abdomen Limited  Result Date: 03/01/2019 CLINICAL DATA:  Cirrhosis.  Hepatitis C. EXAM: ULTRASOUND ABDOMEN LIMITED RIGHT UPPER QUADRANT COMPARISON:  CT abdomen and pelvis 02/27/2018. Abdominal ultrasound 07/08/2015 and 08/10/2012. FINDINGS: Gallbladder: No generalized gallbladder wall thickening or shadowing gallstones. There are a few mildly echogenic foci in the gallbladder wall with suggestion of mild comet tail artifact, more prominent on the 2013 ultrasound and not well demonstrated  on the 2016 study. No sonographic Murphy sign noted by sonographer. Common bile duct: Diameter: 4 mm Liver: Mildly coarsened echotexture and mildly nodular liver contour without a focal lesion identified. Portal vein is patent on color Doppler imaging with normal direction of blood flow towards the liver. IMPRESSION: 1. Coarse hepatic echotexture with mildly nodular contour which may reflect cirrhosis. No focal liver lesion identified. 2. Suspected tiny cholesterol polyps or  adenomyomatosis involving the gallbladder, more prominent in 2013. Electronically Signed   By: Logan Bores M.D.   On: 03/01/2019 08:02   Dg Chest Port 1 View  Result Date: 02/27/2019 CLINICAL DATA:  Initial evaluation for acute syncope. EXAM: PORTABLE CHEST 1 VIEW COMPARISON:  Prior radiograph from 02/27/2018 FINDINGS: Cardiac and mediastinal silhouettes are stable in size and contour, and remain within normal limits. Lungs normally inflated. Mild streaky left basilar subsegmental atelectasis. No focal infiltrates. No pulmonary edema or pleural effusion. No pneumothorax. No acute osseous finding. IMPRESSION: 1. Mild left basilar subsegmental atelectasis. 2. No other active cardiopulmonary disease. Electronically Signed   By: Jeannine Boga M.D.   On: 02/27/2019 20:28   Vas US Carotid  Result Date: 02/28/2019 Carotid Arterial Duplex Study Indications:   Summary: Right Carotid: Velocities in the right ICA are consistent with a 60-79%                stenosis. Left Carotid: Velocities in the left ICA are consistent with a 1-39% stenosis. Vertebrals: Bilateral vertebral arteries demonstrate antegrade flow. *See table(s) above for measurements and observations.  Electronically signed by Monica Martinez MD on 02/28/2019 at 6:19:38 PM.    Final    Scheduled Meds:  allopurinol  150 mg Oral Daily   atorvastatin  40 mg Oral QHS   benzonatate  100 mg Oral TID   docusate sodium  100 mg Oral Daily   gabapentin  300 mg Oral BID   pantoprazole  40 mg Oral Daily   sertraline  200 mg Oral Daily   sodium chloride flush  3 mL Intravenous Q12H   Continuous Infusions: PRN Meds:.acetaminophen **OR** acetaminophen, traZODone   ASSESMENT:   *   Anemia.  Not in need of transfusion. No recent baseline Hgb for comp.  Not iron or Folate deficient.   Hgb 9.4 >> 10.3.  No transfusions.    *   FOBT +, no acute bleeding.   Previous Colonoscopies at [place] in [place] of possible AVMs in notes,  procedure report inaccessible.    *   LOC, agonal breathing.   Head CT neg for acute issues.  Confirms vascular sm vsl dz, pt with baseline mild dementia.  Normal EEG.     *   Xarelto for a fib, on hold.  Previous ablation.  *   AKI +/- CKD.  Renal fx improved.  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   *   Coarse, mildly nodular liver per [place] today.  Suspect cirrhosis.   Hep C, eradicated per hx.    *   Thrombocytopenia, non-critical.  Chronic.    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      PLAN   *  Wife: [phone] if call needed.  Spoke with her yesterday, very pleasant and fairly  well informed medically.    *  Safe to restart Eliquis from GI perspective.  Given Hem + and anemia, will add 1x daily iron to prevent iron deficency.  Continue Protonix 40 mg/day.    *  B12 level. If deficient, will need treatment.   Avoid Ibuprofen, on his prn med list   *   No plans for endoscopic procedures.  Can return to [place] for eval of Heme positive anemia and EGD for variceal screening +/- colonoscopy (told in past no need for repeat given no hx polyps, risk factors for colon ca and age) in next several weeks.  Dr Hassell Done is his MD there. Maryanna Shape is signing off.      *  Regular diet ordered.   Azucena Freed  03/01/2019, 8:30 AM Phone [phone]  GI ATTENDING  Case discussed with Dr. Rush Landmark.  Interval history and data reviewed.  Agree with interval progress note as outlined above.  No meaningful GI bleeding.  Hemoglobin quite stable.  Agree with daily iron supplement and continuing PPI for upper GI mucosal protection.  No further plans at this time GI.  He can resume his regular outpatient GI care at the [place].  If there are any additional questions feel free to call.  We will sign off.  Docia Chuck. Geri Seminole., M.D. [facility] Division of Gastroenterology

## 2019-03-01 NOTE — Progress Notes (Signed)
Mrs. Robin (wife) called-  Patient has early dementia so cannot remember everything clearly.   Please call Mrs. Jacome with updates and before any procedures at [phone].

## 2019-11-28 DIAGNOSIS — H401122 Primary open-angle glaucoma, left eye, moderate stage: Secondary | ICD-10-CM | POA: Diagnosis not present

## 2019-11-28 DIAGNOSIS — H2512 Age-related nuclear cataract, left eye: Secondary | ICD-10-CM | POA: Diagnosis not present

## 2020-03-17 DIAGNOSIS — H401131 Primary open-angle glaucoma, bilateral, mild stage: Secondary | ICD-10-CM | POA: Diagnosis not present

## 2020-05-06 IMAGING — CT CT HEAD WITHOUT CONTRAST
3 series · 15 of 47 positions shown, 18 images · non-contrast
Comparison: None.

CLINICAL DATA: Syncope.

EXAM:
CT HEAD WITHOUT CONTRAST
TECHNIQUE: Contiguous axial images were obtained from the base of the skull
through the vertex without intravenous contrast.

[Series 3: head 5.0 h30s · axial · 0.46mm/px · z∈[-78,+77]mm · 9 of 37 slices shown, 12 images]
[im 3/37  brain]
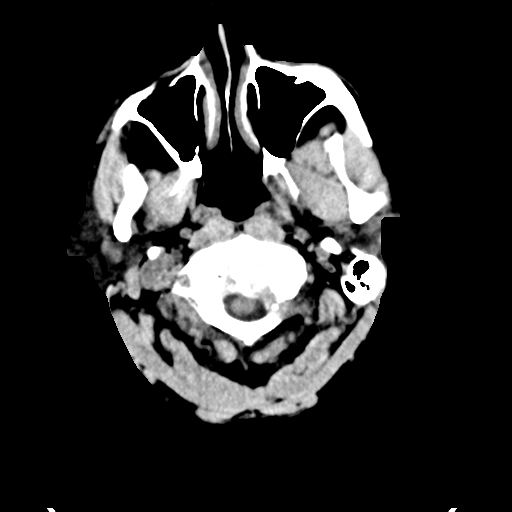
[im 3/37  bone]
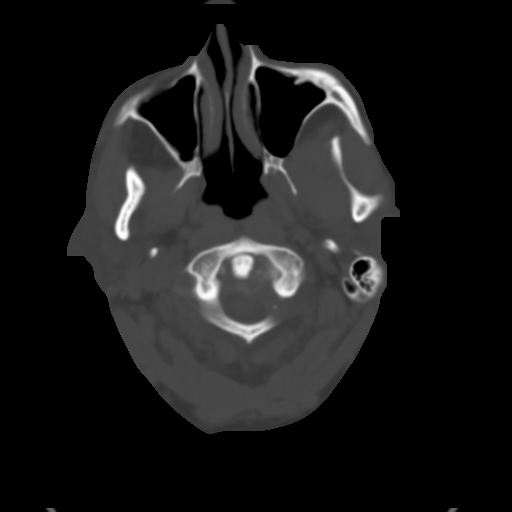
[im 7/37  brain]
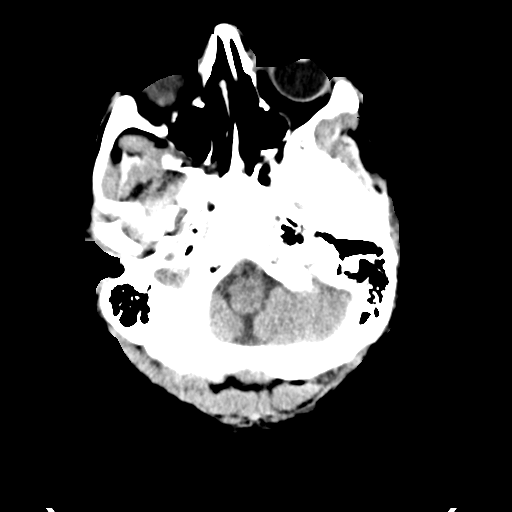
[im 10/37  brain]
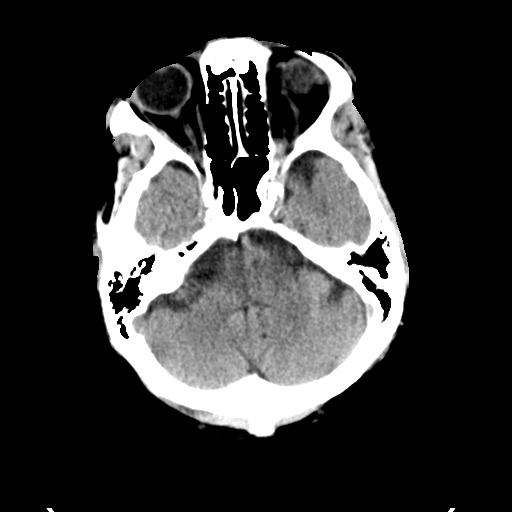
[im 14/37  brain]
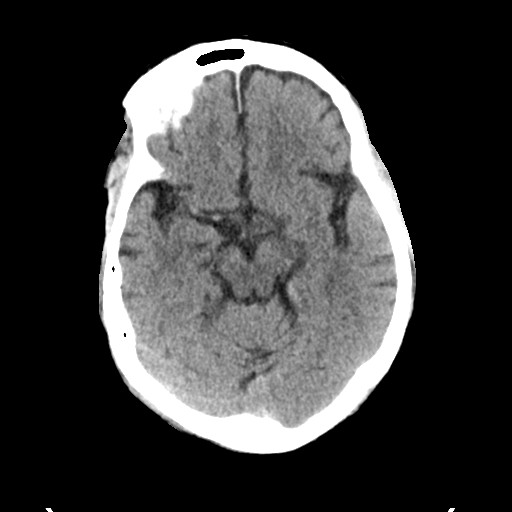
[im 19/37  brain]
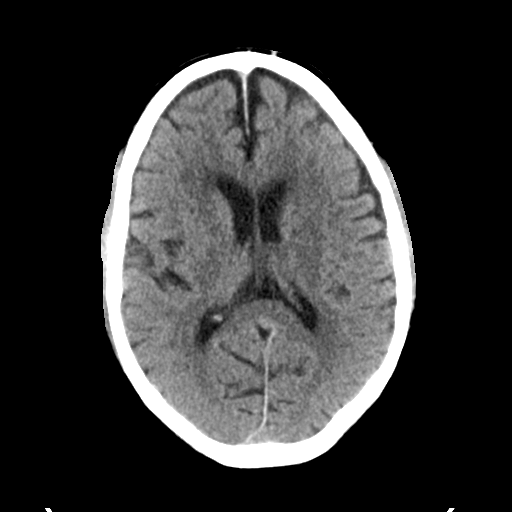
[im 19/37  bone]
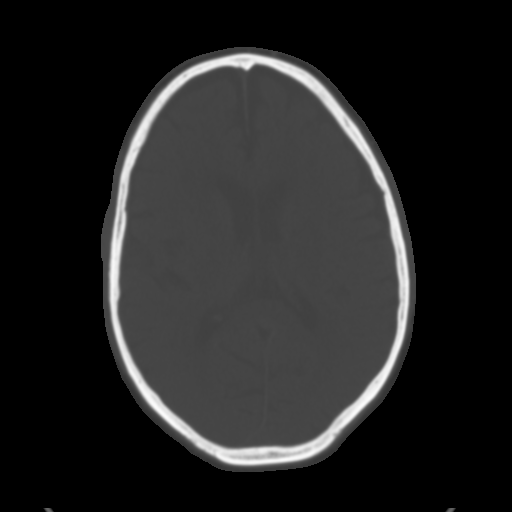
[im 23/37  brain]
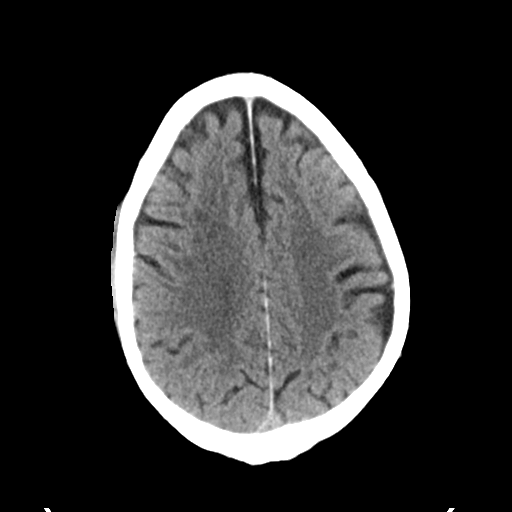
[im 27/37  brain]
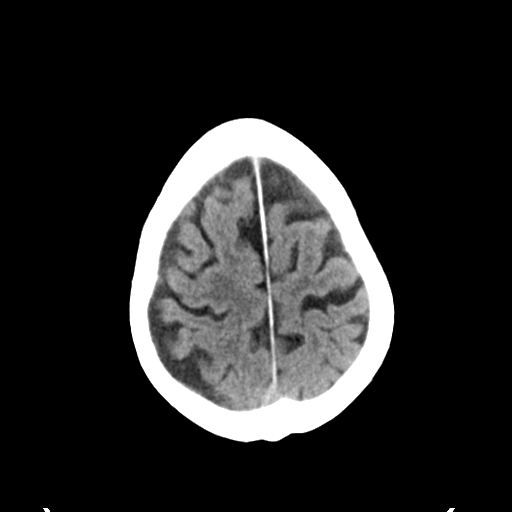
[im 30/37  brain]
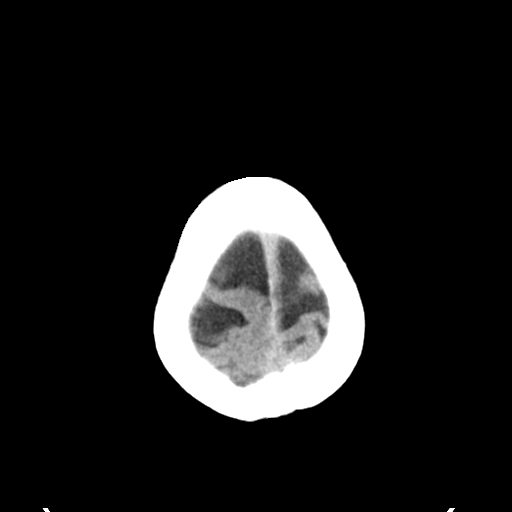
[im 34/37  brain]
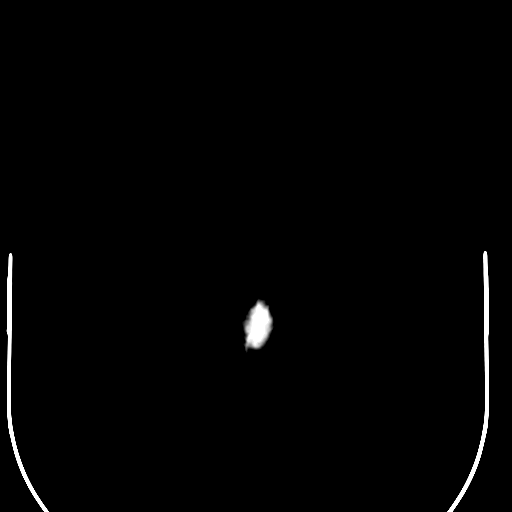
[im 34/37  bone]
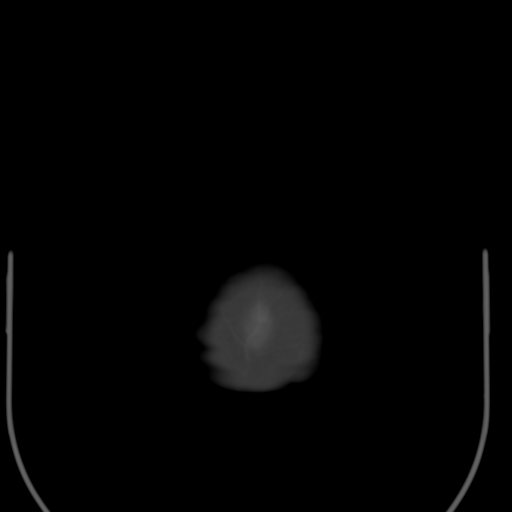

[Series 5: head 3.0 mpr cor · coronal · 0.36mm/px · 3 of 76 slices shown]
[im 26/76  brain]
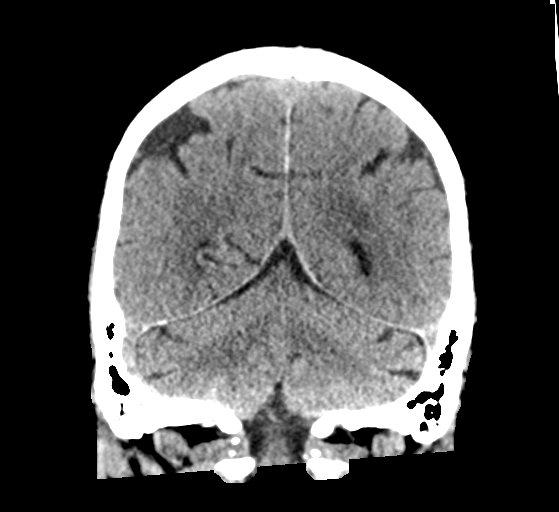
[im 34/76  brain]
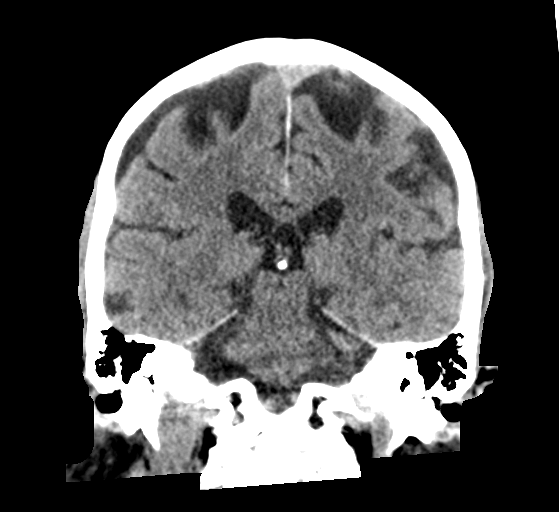
[im 42/76  brain]
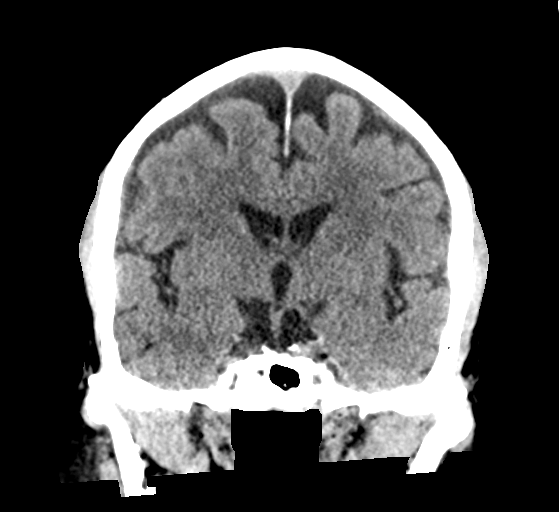

[Series 6: head 3.0 mpr sag · sagittal · 0.36mm/px · 3 of 60 slices shown]
[im 20/60  brain]
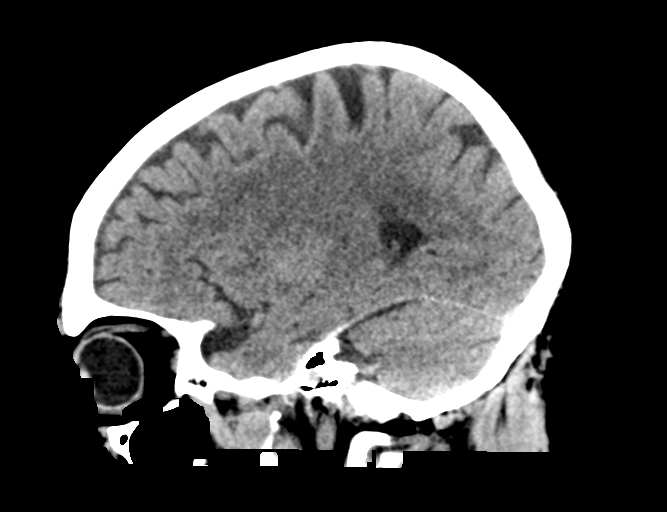
[im 30/60  brain]
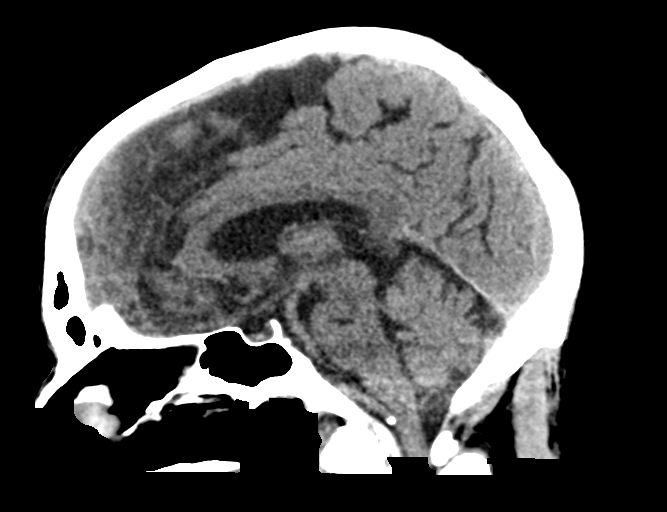
[im 40/60  brain]
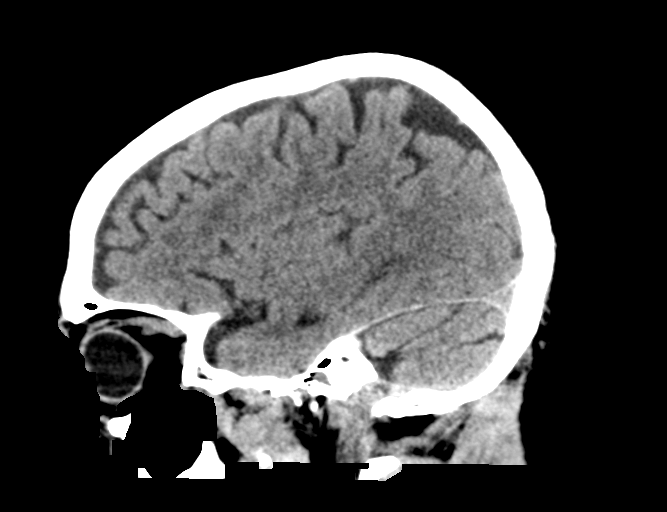

[15 of 47 positions shown; findings below may reference images not displayed]

FINDINGS: Brain: No intracranial hemorrhage, mass effect, or midline shift.
Generalized atrophy may be slightly advanced for age. Mild chronic
small vessel ischemia. Remote lacunar infarcts in the bilateral
basal ganglia. No hydrocephalus. The basilar cisterns are patent. No
evidence of territorial infarct or acute ischemia. No extra-axial or
intracranial fluid collection.

Vascular: Atherosclerosis of skullbase vasculature without
hyperdense vessel or abnormal calcification.

Skull: No fracture or focal lesion.

Sinuses/Orbits: Small mucous retention cyst in the left maxillary
sinus. No acute findings.

Other: None.
IMPRESSION: 1. No acute intracranial abnormality.
2. Mild atrophy and chronic small vessel ischemia. Remote lacunar
infarcts in the basal ganglia.

## 2020-05-06 IMAGING — DX PORTABLE CHEST - 1 VIEW
2 series · 2 of 2 positions shown · non-contrast
Comparison: Prior radiograph from 02/27/2018

CLINICAL DATA: Initial evaluation for acute syncope.

EXAM:
PORTABLE CHEST 1 VIEW

[chest ap (1 of 2)]
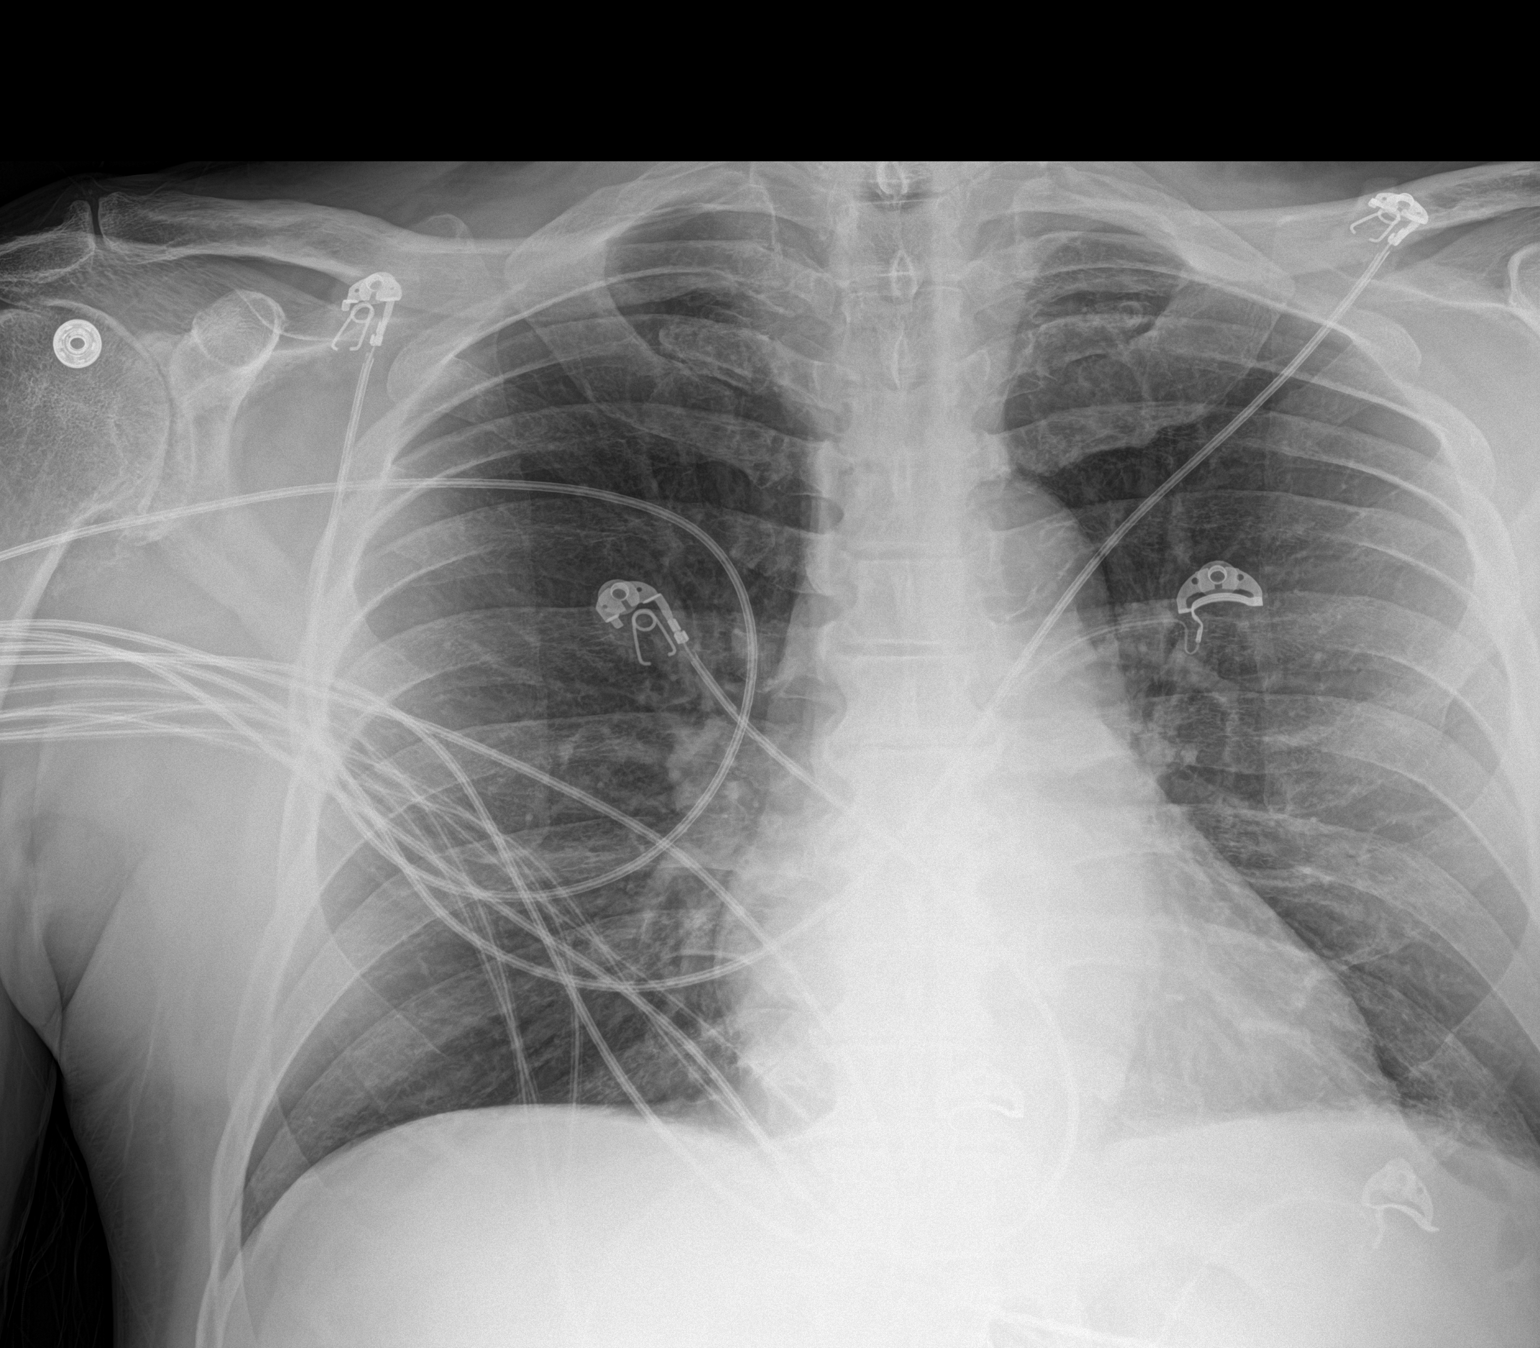

[chest ap (2 of 2)]
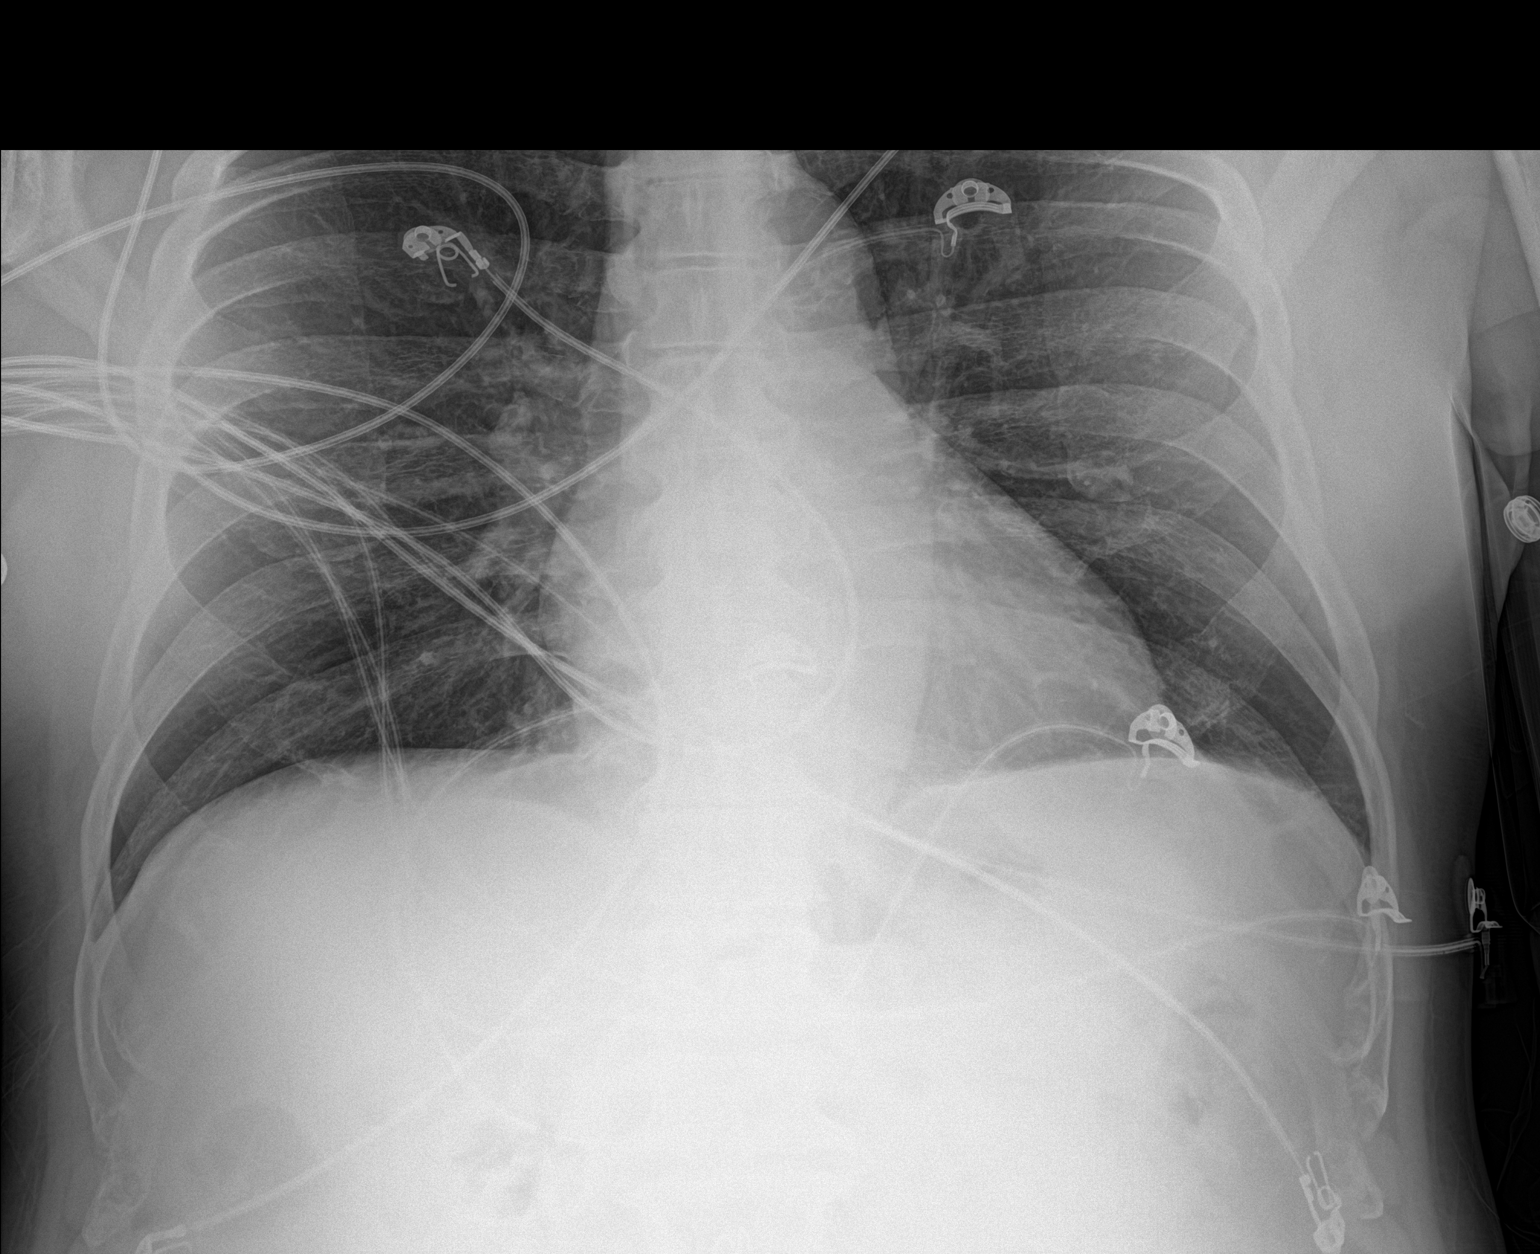

[2 of 2 positions shown; findings below may reference images not displayed]

FINDINGS: Cardiac and mediastinal silhouettes are stable in size and contour,
and remain within normal limits.

Lungs normally inflated. Mild streaky left basilar subsegmental
atelectasis. No focal infiltrates. No pulmonary edema or pleural
effusion. No pneumothorax.

No acute osseous finding.
IMPRESSION: 1. Mild left basilar subsegmental atelectasis.
2. No other active cardiopulmonary disease.

## 2021-03-29 DIAGNOSIS — H52223 Regular astigmatism, bilateral: Secondary | ICD-10-CM | POA: Diagnosis not present

## 2021-04-14 DIAGNOSIS — H00014 Hordeolum externum left upper eyelid: Secondary | ICD-10-CM | POA: Diagnosis not present

## 2021-05-05 DIAGNOSIS — H00021 Hordeolum internum right upper eyelid: Secondary | ICD-10-CM | POA: Diagnosis not present

## 2021-05-05 DIAGNOSIS — H00014 Hordeolum externum left upper eyelid: Secondary | ICD-10-CM | POA: Diagnosis not present

## 2021-05-28 DIAGNOSIS — H35372 Puckering of macula, left eye: Secondary | ICD-10-CM | POA: Diagnosis not present

## 2021-05-28 DIAGNOSIS — H0014 Chalazion left upper eyelid: Secondary | ICD-10-CM | POA: Diagnosis not present

## 2021-05-28 DIAGNOSIS — H0011 Chalazion right upper eyelid: Secondary | ICD-10-CM | POA: Diagnosis not present

## 2021-05-28 DIAGNOSIS — H0015 Chalazion left lower eyelid: Secondary | ICD-10-CM | POA: Diagnosis not present

## 2021-06-24 DIAGNOSIS — F0280 Dementia in other diseases classified elsewhere without behavioral disturbance: Secondary | ICD-10-CM | POA: Diagnosis not present

## 2021-06-24 DIAGNOSIS — H0014 Chalazion left upper eyelid: Secondary | ICD-10-CM | POA: Diagnosis not present

## 2021-06-24 DIAGNOSIS — G309 Alzheimer's disease, unspecified: Secondary | ICD-10-CM | POA: Diagnosis not present

## 2022-06-29 ENCOUNTER — Emergency Department: Payer: No Typology Code available for payment source

## 2022-06-29 ENCOUNTER — Emergency Department
Admission: EM | Admit: 2022-06-29 | Discharge: 2022-06-29 | Disposition: A | Discharge status: Home / Self Care | Payer: No Typology Code available for payment source | Attending: Emergency Medicine | Admitting: Emergency Medicine

## 2022-06-29 ENCOUNTER — Other Ambulatory Visit: Payer: Self-pay

## 2022-06-29 ENCOUNTER — Encounter: Payer: Self-pay | Admitting: Emergency Medicine

## 2022-06-29 DIAGNOSIS — I129 Hypertensive chronic kidney disease with stage 1 through stage 4 chronic kidney disease, or unspecified chronic kidney disease: Secondary | ICD-10-CM | POA: Diagnosis not present

## 2022-06-29 DIAGNOSIS — N183 Chronic kidney disease, stage 3 unspecified: Secondary | ICD-10-CM | POA: Diagnosis not present

## 2022-06-29 DIAGNOSIS — U071 COVID-19: Secondary | ICD-10-CM | POA: Insufficient documentation

## 2022-06-29 DIAGNOSIS — R059 Cough, unspecified: Secondary | ICD-10-CM | POA: Diagnosis present

## 2022-06-29 LAB — SARS CORONAVIRUS 2 BY RT PCR: SARS Coronavirus 2 by RT PCR: POSITIVE — AB

## 2022-06-29 MED ORDER — ALBUTEROL SULFATE HFA 108 (90 BASE) MCG/ACT IN AERS: 2.0000 | INHALATION_SPRAY | Freq: Four times a day (QID) | RESPIRATORY_TRACT | 2 refills | Status: AC | PRN

## 2022-06-29 MED ORDER — CEPACOL REGULAR STRENGTH 3 MG MT LOZG: 1.0000 | LOZENGE | OROMUCOSAL | 12 refills | Status: AC | PRN

## 2022-06-29 MED ORDER — FLUTICASONE PROPIONATE 50 MCG/ACT NA SUSP: 1.0000 | Freq: Every day | NASAL | 0 refills | Status: AC

## 2022-06-29 NOTE — ED Triage Notes (Signed)
Pt endorses cough since for the last two days. Pt daughter sts that normally he goes to the [place] but they advised him to come to the ED for a rule out of COVID or Pneumonia. Pt is also having a productive cough with green sputum.

## 2022-06-29 NOTE — Discharge Instructions (Addendum)
-  Continue taking your regular medications. You make take the albuterol as needed for cough/SOB. You may take the cepacol for sore throat.  -Please follow up with you regular doctor within the next few days for reevaluation  -Return to the emergency department at any time if you begin to experience any new or worsening symptoms.

## 2022-06-29 NOTE — ED Provider Notes (Signed)
[facility] Provider Note    Event Date/Time   First MD Initiated Contact with Patient 06/29/22 1309     (approximate)   History   Chief Complaint Cough   HPI Clinton Thomas is a 76 y.o. male, history of gout, hypertension, atrial flutter, thrombocytopenia, CKD stage III, presents emergency department for evaluation of cough.  He has been having productive cough with green sputum.  He states he has been having a cough for the past 2 days, but was advised to come to the ED by the [place] to rule out COVID/pneumonia.  Denies chest pain, shortness of breath, abdominal pain, nausea/vomiting, diarrhea, urinary symptoms, headache, dizziness/lightheadedness, or rash/lesions.  History Limitations: No limitations.        Physical Exam  Triage Vital Signs: ED Triage Vitals  Enc Vitals Group     BP 06/29/22 1241 119/62     Pulse Rate 06/29/22 1241 63     Resp 06/29/22 1241 17     Temp 06/29/22 1241 98.4 F (36.9 C)     Temp Source 06/29/22 1241 Oral     SpO2 06/29/22 1241 100 %     Weight 06/29/22 1242 214 lb (97.1 kg)     Height 06/29/22 1306 '6\' 1"'$  (1.854 m)     Head Circumference --      Peak Flow --      Pain Score 06/29/22 1242 2     Pain Loc --      Pain Edu? --      Excl. in [place]? --     Most recent vital signs: Vitals:   06/29/22 1441 06/29/22 1443  BP: 120/60   Pulse: 60   Resp:    Temp:  98 F (36.7 C)  SpO2: 100%     General: Awake, NAD.  Active productive cough. Skin: Warm, dry. No rashes or lesions.  Eyes: PERRL. Conjunctivae normal.  CV: Good peripheral perfusion.  Resp: Normal effort.  Mild rhonchi appreciated in the bases bilaterally. Abd: Soft, non-tender. No distention.  Neuro: At baseline. No gross neurological deficits.   Focused Exam: Throat exam unremarkable.  No tonsillar exudates or uvular deviation.  Physical Exam    ED Results / Procedures / Treatments  Labs (all labs ordered are listed, but only abnormal results  are displayed) Labs Reviewed  SARS CORONAVIRUS 2 BY RT PCR - Abnormal; Notable for the following components:      Result Value   SARS Coronavirus 2 by RT PCR POSITIVE (*)    All other components within normal limits     EKG N/A.   RADIOLOGY  ED Provider Interpretation: I personally reviewed and interpreted this x-ray, no evidence of acute cardiopulmonary process.  DG Chest 2 View  Result Date: 06/29/2022 CLINICAL DATA:  Cough EXAM: CHEST - 2 VIEW COMPARISON:  02/27/2019 FINDINGS: Cardiac and mediastinal contours are within normal limits. No focal pulmonary opacity. No pleural effusion or pneumothorax. No acute osseous abnormality. IMPRESSION: No acute cardiopulmonary process. Electronically Signed   By: Merilyn Baba M.D.   On: 06/29/2022 13:52    PROCEDURES:  Critical Care performed: N/A.  Procedures    MEDICATIONS ORDERED IN ED: Medications - No data to display   IMPRESSION / MDM / [place] / ED COURSE  I reviewed the triage vital signs and the nursing notes.  Differential diagnosis includes, but is not limited to, COVID-19, influenza, pneumonia, bronchitis, viral URI.  ED Course Patient appears well, vitals within normal limits.  NAD.  COVID-19 PCR positive.  Assessment/Plan Presentation consistent with COVID-19 infection, confirmed by PCR.  Chest x-ray does not show any evidence of pneumonia.  Patient appears well clinically.  He does not qualify for antivirals given his GFR of 24.  Will provide him with symptomatic management with albuterol inhaler and topical.  He is currently already taking Mucinex and Tylenol/ibuprofen at home.  He is additionally requesting a refill for his fluticasone, will provide.  Encouraged him to follow-up with his primary care provider within the next few days to ensure some improvement in his symptoms.  Will discharge  Considered admission for this patient, but given his stable presentation and  access to close follow-up, he is unlikely to benefit from admission.  Provided the patient with anticipatory guidance, return precautions, and educational material. Encouraged the patient to return to the emergency department at any time if they begin to experience any new or worsening symptoms. Patient expressed understanding and agreed with the plan.   Patient's presentation is most consistent with acute complicated illness / injury requiring diagnostic workup.       FINAL CLINICAL IMPRESSION(S) / ED DIAGNOSES   Final diagnoses:  COVID-19     Rx / DC Orders   ED Discharge Orders          Ordered    fluticasone (FLONASE) 50 MCG/ACT nasal spray  Daily        06/29/22 1435    menthol-cetylpyridinium (CEPACOL REGULAR STRENGTH) 3 MG lozenge  As needed        06/29/22 1435    albuterol (VENTOLIN HFA) 108 (90 Base) MCG/ACT inhaler  Every 6 hours PRN        06/29/22 1435             Note:  This document was prepared using Dragon voice recognition software and may include unintentional dictation errors.   Teodoro Spray, [place] 06/29/22 2152    Blake Divine, MD 06/30/22 [phone]

## 2022-10-03 ENCOUNTER — Ambulatory Visit: Payer: Medicare HMO | Admitting: Podiatry

## 2022-10-03 DIAGNOSIS — M79674 Pain in right toe(s): Secondary | ICD-10-CM | POA: Diagnosis not present

## 2022-10-03 DIAGNOSIS — M79675 Pain in left toe(s): Secondary | ICD-10-CM

## 2022-10-03 DIAGNOSIS — G6289 Other specified polyneuropathies: Secondary | ICD-10-CM | POA: Diagnosis not present

## 2022-10-03 DIAGNOSIS — B353 Tinea pedis: Secondary | ICD-10-CM

## 2022-10-03 DIAGNOSIS — M21612 Bunion of left foot: Secondary | ICD-10-CM

## 2022-10-03 DIAGNOSIS — M2011 Hallux valgus (acquired), right foot: Secondary | ICD-10-CM

## 2022-10-03 DIAGNOSIS — M2012 Hallux valgus (acquired), left foot: Secondary | ICD-10-CM | POA: Diagnosis not present

## 2022-10-03 DIAGNOSIS — B351 Tinea unguium: Secondary | ICD-10-CM | POA: Diagnosis not present

## 2022-10-03 DIAGNOSIS — M21611 Bunion of right foot: Secondary | ICD-10-CM | POA: Diagnosis not present

## 2022-10-03 MED ORDER — KETOCONAZOLE 2 % EX CREA: 1.0000 | TOPICAL_CREAM | Freq: Every day | CUTANEOUS | 2 refills | Status: DC

## 2022-10-03 NOTE — Progress Notes (Signed)
  Subjective:  Patient ID: Clinton Thomas, male    DOB: 06/11/1946,  MRN: [id number]  Chief Complaint  Patient presents with   Callouses    NP - BIL PAIN AT [place] / [facility]    BIL PAIN AT [place] - already on gabapentin     76 y.o. male presents with the above complaint. History confirmed with patient.  He has been having chronic pain in his feet since he had knee surgery on both legs.  He was on gabapentin for some time and they have taken him off this because it was making his dementia worse.  He feels like he has calluses his nails are thick elongated they are not able to cut them.  Objective:  Physical Exam: warm, good capillary refill, no trophic changes or ulcerative lesions, normal DP and PT pulses, normal sensory exam, and tinea pedis.  No significant notable callus.  Bilateral hallux valgus deformity Left Foot: dystrophic yellowed discolored nail plates with subungual debris Right Foot: dystrophic yellowed discolored nail plates with subungual debris   Assessment:   1. Tinea pedis of both feet   2. Pain due to onychomycosis of toenails of both feet   3. Hallux valgus with bunions, left   4. Hallux valgus with bunions, right   5. Other polyneuropathy      Plan:  Patient was evaluated and treated and all questions answered.  We discussed etiology of bunions and hallux valgus deformity and I recommended nonoperative treatment and we discussed wide shoes.  Discussed the etiology and treatment options for tinea pedis.  Discussed topical and oral treatment.  Recommended topical treatment with 2% ketoconazole cream.  This was sent to the patient's pharmacy.  Also discussed appropriate foot hygiene, use of antifungal spray such as Tinactin in shoes, as well as cleaning foot surfaces such as showers and bathroom floors with bleach.  Discussed the etiology and treatment options for the condition in detail with the patient.  Educated patient on the topical and oral treatment options for mycotic nails. Recommended debridement of the nails today. Sharp and mechanical debridement performed of all painful and mycotic nails today. Nails debrided in length and thickness using a nail nipper to level of comfort. Discussed treatment options including appropriate shoe gear. Follow up as needed for painful nails.   Return in about 3 months (around 01/03/2023) for painful thick nails .

## 2023-01-05 ENCOUNTER — Ambulatory Visit: Payer: Medicare PPO | Admitting: Podiatry

## 2023-01-05 ENCOUNTER — Encounter: Payer: Self-pay | Admitting: Podiatry

## 2023-01-05 VITALS — BP 149/71 | HR 85

## 2023-01-05 DIAGNOSIS — N183 Chronic kidney disease, stage 3 unspecified: Secondary | ICD-10-CM

## 2023-01-05 DIAGNOSIS — M79675 Pain in left toe(s): Secondary | ICD-10-CM | POA: Diagnosis not present

## 2023-01-05 DIAGNOSIS — G6289 Other specified polyneuropathies: Secondary | ICD-10-CM | POA: Diagnosis not present

## 2023-01-05 DIAGNOSIS — B351 Tinea unguium: Secondary | ICD-10-CM | POA: Diagnosis not present

## 2023-01-05 DIAGNOSIS — M79674 Pain in right toe(s): Secondary | ICD-10-CM

## 2023-01-05 NOTE — Progress Notes (Signed)
This patient presents to the office for  foot exam. He presents to the office with his caregiver. This patient says there is no pain or discomfort in his feet except for pain from neuropathy..  No history of infection or drainage.  This patient presents to the office for foot exam  per his request.  Vascular  Dorsalis pedis and posterior tibial pulses are palpable  B/L.  Capillary return  WNL.  Temperature gradient is  WNL.  Skin turgor  WNL  Sensorium  Senn Weinstein monofilament wire  WNL. Normal tactile sensation.  Nail Exam  Patient has normal nails with no evidence of bacterial or fungal infection. Nails were done already.  Orthopedic  Exam  Muscle tone and muscle strength  WNL.  No limitations of motion feet  B/L.  No crepitus or joint effusion noted.  Foot type is unremarkable and digits show no abnormalities.  HAV  B/L.  Skin  No open lesions.  Normal skin texture and turgor.   Neuropathy   foot exam was performed.  There is no evidence of vascular or neurologic pathology.  RTC  prn   Gardiner Barefoot DPM

## 2024-01-02 ENCOUNTER — Ambulatory Visit: Payer: Medicare PPO | Admitting: Podiatry

## 2024-01-02 ENCOUNTER — Encounter: Payer: Self-pay | Admitting: Podiatry

## 2024-01-02 VITALS — Ht 73.0 in | Wt 215.4 lb

## 2024-01-02 DIAGNOSIS — Q828 Other specified congenital malformations of skin: Secondary | ICD-10-CM

## 2024-01-02 NOTE — Progress Notes (Signed)
Subjective:  Patient ID: Clinton Thomas, male    DOB: 1946/04/15,  MRN: [id number]  Chief Complaint  Patient presents with   Callouses    Pt is here due to callous on both feet.    78 y.o. male presents with the above complaint.  Patient presents with left submetatarsal 5 porokeratotic lesion painful to touch is progressive gotten worse worse with ambulation worse with pressure he has not seen MRIs prior to seeing me denies any other acute complaints would like to discuss treatment options for this pain scale is 5 out of 10 dull aching nature   Review of Systems: Negative except as noted in the HPI. Denies N/V/F/Ch.  Past Medical History:  Diagnosis Date   Atrial flutter (HCC)    Colon polyp    Gout    Hepatitis C    Hx of echocardiogram    Echo 9/13:  mod to severe LVH, EF 70%, no MV SAM, no LVOT gradient, slight intracavitary LV gradient, mod BAE, mild TR, PASP 33   Hypertension    Microcytic anemia    colo at [facility] 2012 "ok" per patient   Peripheral neuropathy    Thrombocytopenia (HCC)    evaluated by [facility] in past     Current Outpatient Medications:    albuterol (VENTOLIN HFA) 108 (90 Base) MCG/ACT inhaler, Inhale 2 puffs into the lungs every 6 (six) hours as needed for wheezing or shortness of breath., Disp: 8 g, Rfl: 2   allopurinol (ZYLOPRIM) 100 MG tablet, Take 150 mg by mouth daily. , Disp: , Rfl:    amLODipine (NORVASC) 10 MG tablet, Take 1 tablet by mouth daily., Disp: , Rfl:    aspirin EC 81 MG tablet, Take 81 mg by mouth daily., Disp: , Rfl:    atorvastatin (LIPITOR) 40 MG tablet, Take 40 mg by mouth at bedtime., Disp: , Rfl:    benzonatate (TESSALON) 100 MG capsule, Take 100 mg by mouth 3 (three) times daily., Disp: , Rfl:    diclofenac Sodium (VOLTAREN) 1 % GEL, APPLY 2 GRAMS TOPICALLY TWO TIMES A DAY AS NEEDED FOR JOINT DAMAGE CAUSING PAIN, Disp: , Rfl:    docusate sodium (COLACE) 100 MG capsule, Take 100 mg by mouth daily., Disp: , Rfl:    gabapentin  (NEURONTIN) 300 MG capsule, Take 300 mg by mouth 2 (two) times daily., Disp: , Rfl:    guaifenesin (HUMIBID E) 400 MG TABS tablet, TAKE ONE TABLET BY MOUTH EVERY 4 HOURS AS NEEDED FOR COUGH, Disp: , Rfl:    ketoconazole (NIZORAL) 2 % cream, Apply 1 Application topically daily., Disp: 60 g, Rfl: 2   lisinopril (ZESTRIL) 10 MG tablet, Take 1 tablet by mouth daily., Disp: , Rfl:    memantine (NAMENDA) 5 MG tablet, Take 1 tablet by mouth daily., Disp: , Rfl:    menthol-cetylpyridinium (CEPACOL REGULAR STRENGTH) 3 MG lozenge, Take 1 lozenge (3 mg total) by mouth as needed for sore throat., Disp: 100 tablet, Rfl: 12   pantoprazole (PROTONIX) 40 MG tablet, Take 40 mg by mouth daily., Disp: , Rfl:    sertraline (ZOLOFT) 100 MG tablet, Take 200 mg by mouth daily. , Disp: , Rfl:    sodium bicarbonate 650 MG tablet, TAKE TWO TABLETS BY MOUTH TWO TIMES A DAY FOR EXCESS BODY ACID, Disp: , Rfl:    traZODone (DESYREL) 100 MG tablet, Take 100-200 mg by mouth at bedtime as needed for sleep. , Disp: , Rfl:    carvedilol (COREG) 6.25  MG tablet, Take 1 tablet (6.25 mg total) by mouth 2 (two) times daily with a meal for 30 days., Disp: 60 tablet, Rfl: 0   chlorthalidone (HYGROTON) 25 MG tablet, Take 1 tablet (25 mg total) by mouth daily for 30 days., Disp: 30 tablet, Rfl: 0   fluticasone (FLONASE) 50 MCG/ACT nasal spray, Place 1 spray into both nostrils daily., Disp: 1 g, Rfl: 0  Social History   Tobacco Use  Smoking Status Former  Smokeless Tobacco Never    Allergies  Allergen Reactions   Latanoprost     Other Reaction(s): Pain in eye   Linezolid Other (See Comments)    unknown   Objective:  There were no vitals filed for this visit. Body mass index is 28.42 kg/m. Constitutional Well developed. Well nourished.  Vascular Dorsalis pedis pulses palpable bilaterally. Posterior tibial pulses palpable bilaterally. Capillary refill normal to all digits.  No cyanosis or clubbing noted. Pedal hair growth  normal.  Neurologic Normal speech. Oriented to person, place, and time. Epicritic sensation to light touch grossly present bilaterally.  Dermatologic Left submetatarsal 5 porokeratotic lesion painful to touch central nucleated core noted no pinpoint bleeding noted  Orthopedic: Normal joint ROM without pain or crepitus bilaterally. No visible deformities. No bony tenderness.   Radiographs: None Assessment:   1. Porokeratosis    Plan:  Patient was evaluated and treated and all questions answered.  Left submetatarsal 5 porokeratosis -All questions and concerns were discussed with the patient in extensive detail given the amount of pain that he is experiencing benefit from debridement of the lesion using chisel blade to handle the lesion was debrided down to healthy striated tissue.  No complication noted no pinpoint bleeding noted  No follow-ups on file.

## 2024-04-05 ENCOUNTER — Encounter: Payer: Self-pay | Admitting: Podiatry

## 2024-04-05 ENCOUNTER — Ambulatory Visit: Payer: Medicare PPO | Admitting: Podiatry

## 2024-04-05 VITALS — Ht 73.0 in | Wt 215.4 lb

## 2024-04-05 DIAGNOSIS — G6289 Other specified polyneuropathies: Secondary | ICD-10-CM | POA: Diagnosis not present

## 2024-04-05 DIAGNOSIS — B351 Tinea unguium: Secondary | ICD-10-CM | POA: Diagnosis not present

## 2024-04-05 DIAGNOSIS — M79675 Pain in left toe(s): Secondary | ICD-10-CM

## 2024-04-05 DIAGNOSIS — M79674 Pain in right toe(s): Secondary | ICD-10-CM

## 2024-04-08 NOTE — Progress Notes (Signed)
  Subjective:  Patient ID: Clinton Thomas, male    DOB: [birth year],  MRN: [id number]  Clinton Thomas presents to clinic today for at risk foot care with history of peripheral neuropathy and painful mycotic toenails of both feet that are difficult to trim. Pain interferes with daily activities and wearing enclosed shoe gear comfortably.  Chief Complaint  Patient presents with   Nail Problem    Pt is here for [facility] PCP is Dr Beverlyn Buckles and LOV was in April.   New problem(s): None.   PCP is Trivedi, Mehul K, MD.  Allergies  Allergen Reactions   Latanoprost     Other Reaction(s): Pain in eye   Linezolid Other (See Comments)    unknown    Review of Systems: Negative except as noted in the HPI.  Objective: No changes noted in today's physical examination. There were no vitals filed for this visit. Clinton Thomas is a pleasant 78 y.o. male in NAD. AAO x 3.  Vascular Examination: Capillary refill time immediate b/l. Vascular status intact b/l with palpable pedal pulses. Pedal hair present b/l. No pain with calf compression b/l. Skin temperature gradient WNL b/l. No cyanosis or clubbing b/l. No ischemia or gangrene noted b/l.   Neurological Examination: Pt has subjective symptoms of neuropathy. Sensation grossly intact b/l with 10 gram monofilament. Vibratory sensation intact b/l.   Dermatological Examination: Pedal skin with normal turgor, texture and tone b/l.  No open wounds. No interdigital macerations.   Toenails 1-5 b/l thick, discolored, elongated with subungual debris and pain on dorsal palpation.   No corns, calluses nor porokeratotic lesions noted.  Musculoskeletal Examination: Muscle strength 5/5 to all lower extremity muscle groups bilaterally. No pain, crepitus or joint limitation noted with ROM bilateral LE. No gross bony deformities bilaterally.  Radiographs: None  Assessment/Plan: 1. Pain due to onychomycosis of toenails of both feet   2. Other polyneuropathy    Patient was evaluated and treated. All patient's and/or POA's questions/concerns addressed on today's visit. Toenails 1-5 debrided in length and girth without incident. Continue soft, supportive shoe gear daily. Report any pedal injuries to medical professional. Call office if there are any questions/concerns. -Patient/POA to call should there be question/concern in the interim.   Return in about 3 months (around 07/06/2024).  Clinton Thomas, DPM      [place] LOCATION: [address] [place] [address]                   Office [phone]   [facility] LOCATION: [address] [place], [place] [address] Office [phone]

## 2024-05-07 ENCOUNTER — Ambulatory Visit
Attending: Student in an Organized Health Care Education/Training Program | Admitting: Student in an Organized Health Care Education/Training Program

## 2024-05-07 ENCOUNTER — Encounter: Payer: Self-pay | Admitting: Student in an Organized Health Care Education/Training Program

## 2024-05-07 VITALS — BP 124/60 | HR 60 | Temp 97.2°F | Resp 16 | Ht 73.0 in | Wt 210.0 lb

## 2024-05-07 DIAGNOSIS — M47812 Spondylosis without myelopathy or radiculopathy, cervical region: Secondary | ICD-10-CM

## 2024-05-07 DIAGNOSIS — G894 Chronic pain syndrome: Secondary | ICD-10-CM | POA: Diagnosis not present

## 2024-05-07 DIAGNOSIS — M5481 Occipital neuralgia: Secondary | ICD-10-CM | POA: Diagnosis not present

## 2024-05-07 NOTE — Patient Instructions (Signed)
 ______________________________________________________________________    Preparing for your procedure  Appointments: If you think you may not be able to keep your appointment, call 24-48 hours in advance to cancel. We need time to make it available to others.  Procedure visits are for procedures only. During your procedure appointment there will be: NO Prescription Refills*. NO medication changes or discussions*. NO discussion of disability issues*. NO unrelated pain problem evaluations*. NO evaluations to order other pain procedures*. *These will be addressed at a separate and distinct evaluation encounter on the provider's evaluation schedule and not during procedure days.  Instructions: Food intake: Avoid eating anything solid for at least 8 hours prior to your procedure. Clear liquid intake: You may take clear liquids such as water up to 2 hours prior to your procedure. (No carbonated drinks. No soda.) Transportation: Unless otherwise stated by your physician, bring a driver. (Driver cannot be a Market researcher, Pharmacist, community, or any other form of public transportation.) Morning Medicines: Except for blood thinners, take all of your other morning medications with a sip of water. Make sure to take your heart and blood pressure medicines. If your blood pressure's lower number is above 100, the case will be rescheduled. Blood thinners: Make sure to stop your blood thinners as instructed.  If you take a blood thinner, but were not instructed to stop it, call our office [phone] and ask to talk to a nurse. Not stopping a blood thinner prior to certain procedures could lead to serious complications. Diabetics on insulin: Notify the staff so that you can be scheduled 1st case in the morning. If your diabetes requires high dose insulin, take only  of your normal insulin dose the morning of the procedure and notify the staff that you have done so. Preventing infections: Shower with an antibacterial soap the  morning of your procedure.  Build-up your immune system: Take 1000 mg of Vitamin C with every meal (3 times a day) the day prior to your procedure. Antibiotics: Inform the nursing staff if you are taking any antibiotics or if you have any conditions that may require antibiotics prior to procedures. (Example: recent joint implants)   Pregnancy: If you are pregnant make sure to notify the nursing staff. Not doing so may result in injury to the fetus, including death.  Sickness: If you have a cold, fever, or any active infections, call and cancel or reschedule your procedure. Receiving steroids while having an infection may result in complications. Arrival: You must be in the facility at least 30 minutes prior to your scheduled procedure. Tardiness: Your scheduled time is also the cutoff time. If you do not arrive at least 15 minutes prior to your procedure, you will be rescheduled.  Children: Do not bring any children with you. Make arrangements to keep them home. Dress appropriately: There is always a possibility that your clothing may get soiled. Avoid long dresses. Valuables: Do not bring any jewelry or valuables.  Reasons to call and reschedule or cancel your procedure: (Following these recommendations will minimize the risk of a serious complication.) Surgeries: Avoid having procedures within 2 weeks of any surgery. (Avoid for 2 weeks before or after any surgery). Flu Shots: Avoid having procedures within 2 weeks of a flu shots or . (Avoid for 2 weeks before or after immunizations). Barium: Avoid having a procedure within 7-10 days after having had a radiological study involving the use of radiological contrast. (Myelograms, Barium swallow or enema study). Heart attacks: Avoid any elective procedures or surgeries for the  initial 6 months after a "Myocardial Infarction" (Heart Attack). Blood thinners: It is imperative that you stop these medications before procedures. Let us know if you if you take  any blood thinner.  Infection: Avoid procedures during or within two weeks of an infection (including chest colds or gastrointestinal problems). Symptoms associated with infections include: Localized redness, fever, chills, night sweats or profuse sweating, burning sensation when voiding, cough, congestion, stuffiness, runny nose, sore throat, diarrhea, nausea, vomiting, cold or Flu symptoms, recent or current infections. It is specially important if the infection is over the area that we intend to treat. Heart and lung problems: Symptoms that may suggest an active cardiopulmonary problem include: cough, chest pain, breathing difficulties or shortness of breath, dizziness, ankle swelling, uncontrolled high or unusually low blood pressure, and/or palpitations. If you are experiencing any of these symptoms, cancel your procedure and contact your primary care physician for an evaluation.  Remember:  Regular Business hours are:  Monday to Thursday 8:00 AM to 4:00 PM  Provider's Schedule: Delano Metz, MD:  Procedure days: Tuesday and Thursday 7:30 AM to 4:00 PM  Edward Jolly, MD:  Procedure days: Monday and Wednesday 7:30 AM to 4:00 PM Last  Updated: 10/31/2023 ______________________________________________________________________    Facet Joint Block The facet joints connect the bones of the spine (vertebrae). They let you bend, twist, and make other movements with your spine. They also keep you from bending too far, twisting too far, and making other extreme movements. A facet joint block is a procedure where a numbing medicine (local anesthesia) is injected into a facet joint. Many times, a medicine for inflammation (steroid) is also injected. A facet joint block may be done: To diagnose neck or back pain. If the pain gets better after a facet joint block, the pain is likely coming from the facet joint. If the pain does not get better, the pain is likely not coming from the facet joint. To  treat neck or back pain caused by an inflamed facet joint. To help you with physical therapy or other rehab (rehabilitation) exercises. Tell a health care provider about: Any allergies you have. All medicines you are taking, including vitamins, herbs, eye drops, creams, and over-the-counter medicines. Any problems you or family members have had with anesthesia. Any bleeding problems you have. Any surgeries you have had. Any medical conditions you have or have had. Whether you are pregnant or may be pregnant. What are the risks? Your health care provider will talk with you about risks. These may include: Infection. Allergic reactions to medicines or dyes. Bleeding. Injury to a nerve near where the needle was put in (injection site). Pain at the injection site. Short-term weakness or numbness in areas near the nerves at the injection site. What happens before the procedure? When to stop eating and drinking Follow instructions from your health care provider about what you may eat and drink. Medicines Ask your health care provider about: Changing or stopping your regular medicines. These include any diabetes medicines or blood thinners you take. Taking medicines such as aspirin and ibuprofen. These medicines can thin your blood. Do not take these medicines unless your health care provider tells you to. Taking over-the-counter medicines, vitamins, herbs, and supplements. General instructions If you will be going home right after the procedure, plan to have a responsible adult: Take you home from the hospital or clinic. You will not be allowed to drive. Care for you for the time you are told. Ask your health care provider:  How your injection site will be marked. What steps will be taken to help prevent infection. These may include washing skin with a soap that kills germs. What happens during the procedure?  An IV will be inserted into one of your veins. You will lie on your stomach on  an X-ray table. You may be asked to lie in a different position if you will be getting an injection in your neck. Your injection site will be cleaned with a soap that kills germs and then covered with a germ-free (sterile) drape. A local anesthesia will be put in at the injection site. A type of X-ray machine (fluoroscopy) or CT scan will be used to help find your facet joint. A contrast dye may also be injected into your joint to help show if the needle is at the joint. When your provider knows the needle is at your joint, they will inject anesthesia and anti-inflammatory medicine as needed. The needle will be removed. Pressure will be applied to keep your injection site from bleeding. A bandage (dressing) will be placed over each injection site. The procedure may vary among health care providers and hospitals. What happens after the procedure? Your blood pressure, heart rate, breathing rate, and blood oxygen level will be monitored until you leave the hospital or clinic. This information is not intended to replace advice given to you by your health care provider. Make sure you discuss any questions you have with your health care provider. Document Revised: 05/20/2022 Document Reviewed: 05/20/2022 [employer] Patient Education  2024 [employer].

## 2024-05-07 NOTE — Progress Notes (Signed)
 Safety precautions to be maintained throughout the outpatient stay will include: orient to surroundings, keep bed in low position, maintain call bell within reach at all times, provide assistance with transfer out of bed and ambulation.

## 2024-05-07 NOTE — Progress Notes (Signed)
 PROVIDER NOTE: Interpretation of information contained herein should be left to medically-trained personnel. Specific patient instructions are provided elsewhere under Patient Instructions section of medical record. This document was created in part using AI and STT-dictation technology, any transcriptional errors that may result from this process are unintentional.  Patient: Clinton Thomas  Service: E/M Encounter  Provider: Cephus Collin, MD  DOB: [birth year]  Delivery: Face-to-face  Specialty: Interventional Pain Management  MRN: [id number]  Setting: Ambulatory outpatient facility  Specialty designation: 09  Type: New Patient  Location: Outpatient office facility  PCP: Cathaleen Clinton, MD  DOS: 05/07/2024    Referring Prov.: Guadalupe Lee, Zita Hind, MD   Primary Reason(s) for Visit: Encounter for initial evaluation of one or more chronic problems (new to examiner) potentially causing chronic pain, and posing a threat to normal musculoskeletal function. (Level of risk: High) CC: Neck Pain and Back Pain (Lower radiates down legs to feet bilateral, left worse)  HPI  Clinton Thomas is a 78 y.o. year old, male patient, who comes for the first time to our practice referred by [name], MD for our initial evaluation of his chronic pain. He has GOUT; Essential hypertension; Atrial flutter (HCC); Hx of colonic polyps; Hx of hepatitis C; Cervico-occipital neuralgia; Anemia; Hx of thrombocytopenia; Paroxysmal atrial flutter (HCC); On continuous oral anticoagulation, beginning today for possible ablation; Syncope; CKD (chronic kidney disease) stage 3, GFR 30-59 ml/min (HCC); Normocytic anemia; Heme positive stool; Abnormal ultrasound of liver; Cervical facet joint syndrome; and Chronic pain syndrome on their problem list. Today he comes in for evaluation of his Neck Pain and Back Pain (Lower radiates down legs to feet bilateral, left worse)  Pain Assessment: Location: Mid Head Radiating: radiates to shoulders  bilateral and up back of head will sometimes radiated around to front Onset: More than a month ago Duration: Chronic pain Quality: Aching, Nagging, Discomfort, Sharp, Shooting, Spasm, Tender, Throbbing, Crying, Crushing, Constant, Burning (lower back, 2nd pain radiates down both legs to feet) Severity: 8 /10 (subjective, self-reported pain score)  Effect on ADL: unable to do anything. turn head, movement Timing: Constant Modifying factors: medication, rest BP: 124/60  HR: 60  Onset and Duration: Gradual and Date of onset: being in [employer] Cause of pain: Unknown Severity: Getting worse Timing: Not influenced by the time of the day and PTSD Aggravating Factors: Motion, Twisting, and Walking Alleviating Factors: States nothing is helping him. Associated Problems: Depression, Dizziness, Erectile dysfunction, Fatigue, Inability to concentrate, and Personality changes Quality of Pain: Aching, Annoying, Constant, Cruel, Deep, Disabling, Dreadful, and Nagging Previous Examinations or Tests: MRI scan, X-rays, and Neurological evaluation Previous Treatments: The patient denies any treatments  Mr. Trapp is being evaluated for possible interventional pain management therapies for the treatment of his chronic pain.   Discussed the use of AI scribe software for clinical note transcription with the patient, who gave verbal consent to proceed.  History of Present Illness   Clinton Thomas is a 78 year old male with cervical degenerative disc disease and prior cervical fusion who presents with severe neck pain. [facility] for evaluation of his neck pain.  For the past three to six months, he has been experiencing severe neck pain radiating from the neck to the top of the head, often accompanied by severe headaches every morning, sometimes causing him to wake up in tears. The pain is persistent and significantly affects his quality of life.  He has a history  of  cervical degenerative disc disease and underwent a C3, C4 cervical fusion in 2011. Recent diagnostic workup includes an MRI performed about a week ago and an extensive neck x-ray conducted last Friday. His family reports that there is narrowing of the carotid artery on one side and a pinched nerve along with acute arthritis on the other side.  He has been on various medications for pain management, including anti-inflammatories, muscle relaxers, Tylenol , and opioid medications. He also takes trazodone , which helps him sleep through the pain at night occasionally. He is currently on gabapentin  for nerve pain.  He experiences occasional tingling in his hands, which he describes as 'timber'. No issues with holding utensils or significant strength loss in his hands.  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 He also has PTSD from his service in [place].  He underwent knee replacement surgery on his left leg about five years ago, which was complicated by neuropathy and requires the use of a walker. He has had multiple surgeries, including a skin graft, to address complications from the knee replacement.       Meds   Current Outpatient Medications:    albuterol  (VENTOLIN  HFA) 108 (90 Base) MCG/ACT inhaler, Inhale 2 puffs into the lungs every 6 (six) hours as needed for wheezing or shortness of breath., Disp: 8 g, Rfl: 2   allopurinol  (ZYLOPRIM ) 100 MG tablet, Take 150 mg by mouth daily. , Disp: , Rfl:    amLODipine  (NORVASC ) 10 MG tablet, Take 1 tablet by mouth daily., Disp: , Rfl:    aspirin EC 81 MG tablet, Take 81 mg by mouth daily., Disp: , Rfl:    atorvastatin  (LIPITOR) 40 MG tablet, Take 40 mg by mouth at bedtime., Disp: , Rfl:    benzonatate  (TESSALON ) 100 MG capsule, Take 100 mg by mouth 3 (three) times daily., Disp: , Rfl:    carvedilol  (COREG ) 6.25 MG tablet, Take 1 tablet (6.25 mg total) by mouth 2 (two) times daily with a meal for 30 days.,  Disp: 60 tablet, Rfl: 0   chlorthalidone  (HYGROTON ) 25 MG tablet, Take 1 tablet (25 mg total) by mouth daily for 30 days., Disp: 30 tablet, Rfl: 0   diclofenac Sodium (VOLTAREN) 1 % GEL, APPLY 2 GRAMS TOPICALLY TWO TIMES A DAY AS NEEDED FOR JOINT DAMAGE CAUSING PAIN, Disp: , Rfl:    docusate sodium  (COLACE) 100 MG capsule, Take 100 mg by mouth daily., Disp: , Rfl:    fluticasone  (FLONASE ) 50 MCG/ACT nasal spray, Place 1 spray into both nostrils daily., Disp: 1 g, Rfl: 0   gabapentin  (NEURONTIN ) 300 MG capsule, Take 300 mg by mouth 2 (two) times daily., Disp: , Rfl:    guaifenesin (HUMIBID E) 400 MG TABS tablet, TAKE ONE TABLET BY MOUTH EVERY 4 HOURS AS NEEDED FOR COUGH, Disp: , Rfl:    ketoconazole  (NIZORAL ) 2 % cream, Apply 1 Application topically daily., Disp: 60 g, Rfl: 2   lisinopril (ZESTRIL) 10 MG tablet, Take 1 tablet by mouth daily., Disp: , Rfl:    memantine (NAMENDA) 5 MG tablet, Take 1 tablet by mouth daily., Disp: , Rfl:    menthol-cetylpyridinium (CEPACOL REGULAR STRENGTH) 3 MG lozenge, Take 1 lozenge (3 mg total) by mouth as needed for sore throat., Disp: 100 tablet, Rfl: 12   pantoprazole  (PROTONIX ) 40 MG tablet, Take 40 mg by mouth daily., Disp: , Rfl:    sertraline  (ZOLOFT ) 100 MG tablet, Take 200 mg by mouth daily. ,  Disp: , Rfl:    sodium bicarbonate 650 MG tablet, TAKE TWO TABLETS BY MOUTH TWO TIMES A DAY FOR EXCESS BODY ACID, Disp: , Rfl:    traZODone  (DESYREL ) 100 MG tablet, Take 100-200 mg by mouth at bedtime as needed for sleep. , Disp: , Rfl:   Imaging Review  Knee-R DG 4 views: Results for orders placed during the hospital encounter of 10/04/10  DG Knee Complete 4 Views Right  Narrative Clinical Data: Chronic right knee pain and limited mobility. Severe swelling at the right knee joint.  RIGHT KNEE - COMPLETE 4+ VIEW  Comparison: None.  Findings: Moderate medial and mild lateral and posterior patellar spur formation.  Small effusion.  Atheromatous  arterial calcifications.  IMPRESSION:  1.  Degenerative changes, as described above. 2.  Small effusion.  Provider: Hessie Loss, Hansel Ley Deal    Complexity Note: Imaging results reviewed.                         ROS  Cardiovascular: Daily Aspirin intake and High blood pressure Pulmonary or Respiratory: Shortness of breath, Snoring , and Temporary stoppage of breathing during sleep Neurological: Stroke (Residual deficits or weakness: mild) Psychological-Psychiatric: Psychiatric disorder and Depressed Gastrointestinal: No reported gastrointestinal signs or symptoms such as vomiting or evacuating blood, reflux, heartburn, alternating episodes of diarrhea and constipation, inflamed or scarred liver, or pancreas or irrregular and/or infrequent bowel movements Genitourinary: No reported renal or genitourinary signs or symptoms such as difficulty voiding or producing urine, peeing blood, non-functioning kidney, kidney stones, difficulty emptying the bladder, difficulty controlling the flow of urine, or chronic kidney disease Hematological: No reported hematological signs or symptoms such as prolonged bleeding, low or poor functioning platelets, bruising or bleeding easily, hereditary bleeding problems, low energy levels due to low hemoglobin or being anemic Endocrine: No reported endocrine signs or symptoms such as high or low blood sugar, rapid heart rate due to high thyroid levels, obesity or weight gain due to slow thyroid or thyroid disease Rheumatologic: No reported rheumatological signs and symptoms such as fatigue, joint pain, tenderness, swelling, redness, heat, stiffness, decreased range of motion, with or without associated rash Musculoskeletal: Negative for myasthenia gravis, muscular dystrophy, multiple sclerosis or malignant hyperthermia Work History: Disabled  Allergies  Mr. Ruddy is allergic to latanoprost and linezolid.  Laboratory Chemistry Profile   Renal Lab Results   Component Value Date   BUN 31 (H) 02/28/2019   CREATININE 1.78 (H) 02/28/2019   GFRAA 43 (L) 02/28/2019   GFRNONAA 37 (L) 02/28/2019   PROTEINUR NEGATIVE 02/27/2019     Electrolytes Lab Results  Component Value Date   NA 140 02/28/2019   K 4.3 02/28/2019   CL 110 02/28/2019   CALCIUM  9.4 02/28/2019   MG 2.1 02/27/2019     Hepatic Lab Results  Component Value Date   AST 26 02/27/2019   ALT 22 02/27/2019   ALBUMIN 3.9 02/27/2019   ALKPHOS 64 02/27/2019   LIPASE 66 (H) 02/27/2018     ID Lab Results  Component Value Date   SARSCOV2NAA POSITIVE (A) 06/29/2022   STAPHAUREUS  11/02/2010    NEGATIVE        The Xpert SA Assay (FDA approved for NASAL specimens only), is one component of a comprehensive surveillance program.  It is not intended to diagnose infection nor to guide or monitor treatment.   MRSAPCR NEGATIVE 11/02/2010     Bone No results found for: VD25OH, [id number], [id number], [id number], 25OHVITD1,  25OHVITD2, 25OHVITD3, TESTOFREE, TESTOSTERONE   Endocrine Lab Results  Component Value Date   GLUCOSE 88 02/28/2019   GLUCOSEU NEGATIVE 02/27/2019   TSH 0.70 08/03/2012     Neuropathy No results found for: VITAMINB12, FOLATE, HGBA1C, HIV   CNS No results found for: COLORCSF, APPEARCSF, RBCCOUNTCSF, WBCCSF, POLYSCSF, LYMPHSCSF, EOSCSF, PROTEINCSF, GLUCCSF, JCVIRUS, CSFOLI, IGGCSF, LABACHR, ACETBL   Inflammation (CRP: Acute  ESR: Chronic) Lab Results  Component Value Date   ESRSEDRATE 52 (H) 10/04/2010     Rheumatology Lab Results  Component Value Date   LABURIC 7.2 10/04/2010     Coagulation Lab Results  Component Value Date   INR 1.1 03/01/2019   LABPROT 14.0 03/01/2019   APTT 31 07/30/2012   PLT 108 (L) 03/01/2019   DDIMER (H) 11/09/2010    5.18        AT THE INHOUSE ESTABLISHED CUTOFF VALUE OF 0.48 ug/mL FEU, THIS ASSAY HAS BEEN DOCUMENTED IN THE LITERATURE TO HAVE A SENSITIVITY  AND NEGATIVE PREDICTIVE VALUE OF AT LEAST 98 TO 99%.  THE TEST RESULT SHOULD BE CORRELATED WITH AN ASSESSMENT OF THE CLINICAL PROBABILITY OF DVT / VTE.     Cardiovascular Lab Results  Component Value Date   TROPONINI <0.03 02/27/2019   HGB 10.3 (L) 03/01/2019   HCT 32.0 (L) 03/01/2019     Screening Lab Results  Component Value Date   SARSCOV2NAA POSITIVE (A) 06/29/2022   STAPHAUREUS  11/02/2010    NEGATIVE        The Xpert SA Assay (FDA approved for NASAL specimens only), is one component of a comprehensive surveillance program.  It is not intended to diagnose infection nor to guide or monitor treatment.   MRSAPCR NEGATIVE 11/02/2010     Cancer No results found for: CEA, CA125, LABCA2   Allergens No results found for: ALMOND, APPLE, ASPARAGUS, AVOCADO, BANANA, BARLEY, BASIL, BAYLEAF, GREENBEAN, LIMABEAN, WHITEBEAN, BEEFIGE, REDBEET, BLUEBERRY, BROCCOLI, CABBAGE, MELON, CARROT, CASEIN, CASHEWNUT, CAULIFLOWER, CELERY     Note: Lab results reviewed.  PFSH  Drug: Mr. Dinovo  reports no history of drug use. Alcohol:  reports that he does not currently use alcohol. Tobacco:  reports that he has quit smoking. He has never used smokeless tobacco. Medical:  has a past medical history of Atrial flutter (HCC), Colon polyp, Gout, Hepatitis C, echocardiogram, Hypertension, Microcytic anemia, Peripheral neuropathy, and Thrombocytopenia (HCC). Family: family history includes Arthritis in his sister; COPD in his father; Stroke in his mother.  Past Surgical History:  Procedure Laterality Date   BACK SURGERY     CERVICAL DISC SURGERY     REVISION TOTAL KNEE ARTHROPLASTY     Both   Active Ambulatory Problems    Diagnosis Date Noted   GOUT 10/12/2010   Essential hypertension 10/12/2010   Atrial flutter (HCC) 07/30/2012   Hx of colonic polyps 08/08/2012   Hx of hepatitis C 08/08/2012   Cervico-occipital neuralgia 08/08/2012    Anemia 08/08/2012   Hx of thrombocytopenia 08/08/2012   Paroxysmal atrial flutter (HCC) 09/13/2013   On continuous oral anticoagulation, beginning today for possible ablation 09/13/2013   Syncope 02/27/2019   CKD (chronic kidney disease) stage 3, GFR 30-59 ml/min (HCC) 02/27/2019   Normocytic anemia    Heme positive stool    Abnormal ultrasound of liver    Cervical facet joint syndrome 05/07/2024   Chronic pain syndrome 05/07/2024   Resolved Ambulatory Problems    Diagnosis Date Noted   No Resolved Ambulatory Problems   Past Medical History:  Diagnosis Date   Colon polyp    Gout    Hepatitis C    Hx of echocardiogram    Hypertension    Microcytic anemia    Peripheral neuropathy    Thrombocytopenia (HCC)    Constitutional Exam  General appearance: Well nourished, well developed, and well hydrated. In no apparent acute distress Vitals:   05/07/24 0904  BP: 124/60  Pulse: 60  Resp: 16  Temp: (!) 97.2 F (36.2 C)  SpO2: 100%  Weight: 210 lb (95.3 kg)  Height: 6' 1 (1.854 m)   BMI Assessment: Estimated body mass index is 27.71 kg/m as calculated from the following:   Height as of this encounter: 6' 1 (1.854 m).   Weight as of this encounter: 210 lb (95.3 kg).  BMI interpretation table: BMI level Category Range association with higher incidence of chronic pain  <18 kg/m2 Underweight   18.5-24.9 kg/m2 Ideal body weight   25-29.9 kg/m2 Overweight Increased incidence by 20%  30-34.9 kg/m2 Obese (Class I) Increased incidence by 68%  35-39.9 kg/m2 Severe obesity (Class II) Increased incidence by 136%  >40 kg/m2 Extreme obesity (Class III) Increased incidence by 254%   Patient's current BMI Ideal Body weight  Body mass index is 27.71 kg/m. Ideal body weight: 79.9 kg (176 lb 2.4 oz) Adjusted ideal body weight: 86 kg (189 lb 11 oz)   BMI Readings from Last 4 Encounters:  05/07/24 27.71 kg/m  04/05/24 28.42 kg/m  01/02/24 28.42 kg/m  06/29/22 28.42 kg/m   Wt  Readings from Last 4 Encounters:  05/07/24 210 lb (95.3 kg)  04/05/24 215 lb 6.2 oz (97.7 kg)  01/02/24 215 lb 6.2 oz (97.7 kg)  06/29/22 215 lb 6.2 oz (97.7 kg)    Psych/Mental status: Alert, oriented x 3 (person, place, & time)       Eyes: PERLA Respiratory: No evidence of acute respiratory distress  Cervical Spine Area Exam  Skin & Axial Inspection: No masses, redness, edema, swelling, or associated skin lesions Alignment: Symmetrical Functional ROM: Pain restricted ROM, to the right Stability: No instability detected Muscle Tone/Strength: Functionally intact. No obvious neuro-muscular anomalies detected. Sensory (Neurological): Musculoskeletal pain pattern Palpation: No palpable anomalies             Upper Extremity (UE) Exam    Side: Right upper extremity  Side: Left upper extremity  Skin & Extremity Inspection: Skin color, temperature, and hair growth are WNL. No peripheral edema or cyanosis. No masses, redness, swelling, asymmetry, or associated skin lesions. No contractures.  Skin & Extremity Inspection: Skin color, temperature, and hair growth are WNL. No peripheral edema or cyanosis. No masses, redness, swelling, asymmetry, or associated skin lesions. No contractures.  Functional ROM: Unrestricted ROM          Functional ROM: Unrestricted ROM          Muscle Tone/Strength: Functionally intact. No obvious neuro-muscular anomalies detected.  Muscle Tone/Strength: Functionally intact. No obvious neuro-muscular anomalies detected.  Sensory (Neurological): Unimpaired          Sensory (Neurological): Unimpaired          Palpation: No palpable anomalies              Palpation: No palpable anomalies              Provocative Test(s):  Phalen's test: deferred Tinel's test: deferred Apley's scratch test (touch opposite shoulder):  Action 1 (Across chest): deferred Action 2 (Overhead): deferred Action 3 (LB reach): deferred  Provocative Test(s):  Phalen's test: deferred Tinel's  test: deferred Apley's scratch test (touch opposite shoulder):  Action 1 (Across chest): deferred Action 2 (Overhead): deferred Action 3 (LB reach): deferred     Assessment  Primary Diagnosis & Pertinent Problem List: The primary encounter diagnosis was Cervical spondylosis. Diagnoses of Cervical facet joint syndrome, Cervico-occipital neuralgia, and Chronic pain syndrome were also pertinent to this visit.  Visit Diagnosis (New problems to examiner): 1. Cervical spondylosis   2. Cervical facet joint syndrome   3. Cervico-occipital neuralgia   4. Chronic pain syndrome    Plan of Care (Initial workup plan)  CAEDEN FOOTS has a history of greater than 3 months of moderate to severe pain which is resulted in functional impairment.  The patient has tried various conservative therapeutic options such as NSAIDs, Tylenol , muscle relaxants, physical therapy which was inadequately effective.  Patient's pain is predominantly axial with physical exam findings suggestive of facet arthropathy. Cervical facet medial branch nerve blocks were discussed with the patient.  Risks and benefits were reviewed.  Patient would like to proceed with bilateral C3, C4, C5, C6, C7 medial branch nerve block.  Future considerations: occipital nerve block, RFA  Procedure Orders         CERVICAL FACET (MEDIAL BRANCH NERVE BLOCK)        Provider-requested follow-up: Return in about 15 days (around 05/22/2024) for B/L C3, 4, 5, 6 MBNB, ECT (BLOCK 40 mins).  Future Appointments  Date Time Provider Department Center  05/22/2024 10:00 AM Cephus Collin, MD [facility] None  07/16/2024  8:15 AM Luella Sager, DPM [facility]   I discussed the assessment and treatment plan with the patient. The patient was provided an opportunity to ask questions and all were answered. The patient agreed with the plan and demonstrated an understanding of the instructions.  Patient advised to call back or seek an in-person  evaluation if the symptoms or condition worsens.  Duration of encounter: .  Total time on encounter, as per AMA guidelines included both the face-to-face and non-face-to-face time personally spent by the physician and/or other qualified health care professional(s) on the day of the encounter (includes time in activities that require the physician or other qualified health care professional and does not include time in activities normally performed by clinical staff). Physician's time may include the following activities when performed: Preparing to see the patient (e.g., pre-charting review of records, searching for previously ordered imaging, lab work, and nerve conduction tests) Review of prior analgesic pharmacotherapies. Reviewing PMP Interpreting ordered tests (e.g., lab work, imaging, nerve conduction tests) Performing post-procedure evaluations, including interpretation of diagnostic procedures Obtaining and/or reviewing separately obtained history Performing a medically appropriate examination and/or evaluation Counseling and educating the patient/family/caregiver Ordering medications, tests, or procedures Referring and communicating with other health care professionals (when not separately reported) Documenting clinical information in the electronic or other health record Independently interpreting results (not separately reported) and communicating results to the patient/ family/caregiver Care coordination (not separately reported)  Note by: Cephus Collin, MD (TTS and AI technology used. I apologize for any typographical errors that were not detected and corrected.) Date: 05/07/2024; Time: 10:10 AM

## 2024-05-21 ENCOUNTER — Telehealth: Payer: Self-pay

## 2024-05-22 ENCOUNTER — Ambulatory Visit
Admission: RE | Admit: 2024-05-22 | Discharge: 2024-05-22 | Disposition: A | Discharge status: Home / Self Care | Source: Ambulatory Visit | Attending: Student in an Organized Health Care Education/Training Program | Admitting: Student in an Organized Health Care Education/Training Program

## 2024-05-22 ENCOUNTER — Ambulatory Visit (HOSPITAL_BASED_OUTPATIENT_CLINIC_OR_DEPARTMENT_OTHER): Admitting: Student in an Organized Health Care Education/Training Program

## 2024-05-22 ENCOUNTER — Encounter: Payer: Self-pay | Admitting: Student in an Organized Health Care Education/Training Program

## 2024-05-22 VITALS — BP 126/80 | HR 57 | Temp 97.8°F | Resp 16 | Ht 73.0 in | Wt 210.0 lb

## 2024-05-22 DIAGNOSIS — M5481 Occipital neuralgia: Secondary | ICD-10-CM | POA: Insufficient documentation

## 2024-05-22 DIAGNOSIS — M47812 Spondylosis without myelopathy or radiculopathy, cervical region: Secondary | ICD-10-CM

## 2024-05-22 MED ORDER — DEXAMETHASONE SODIUM PHOSPHATE 10 MG/ML IJ SOLN
20.0000 mg | Freq: Once | INTRAMUSCULAR | Status: AC
  Administered 2024-05-22: 20 mg

## 2024-05-22 MED ORDER — DEXAMETHASONE SODIUM PHOSPHATE 10 MG/ML IJ SOLN
INTRAMUSCULAR | Status: AC
  Filled 2024-05-22: qty 2

## 2024-05-22 MED ORDER — LIDOCAINE HCL 2 % IJ SOLN
20.0000 mL | Freq: Once | INTRAMUSCULAR | Status: AC
  Administered 2024-05-22: 400 mg

## 2024-05-22 MED ORDER — ROPIVACAINE HCL 2 MG/ML IJ SOLN
INTRAMUSCULAR | Status: AC
  Filled 2024-05-22: qty 20

## 2024-05-22 MED ORDER — LACTATED RINGERS IV SOLN: Freq: Once | INTRAVENOUS | Status: AC

## 2024-05-22 MED ORDER — ROPIVACAINE HCL 2 MG/ML IJ SOLN
18.0000 mL | Freq: Once | INTRAMUSCULAR | Status: AC
  Administered 2024-05-22: 18 mL via PERINEURAL

## 2024-05-22 MED ORDER — FENTANYL CITRATE (PF) 100 MCG/2ML IJ SOLN
25.0000 ug | INTRAMUSCULAR | Status: AC | PRN
  Administered 2024-05-22: 25 ug via INTRAVENOUS
  Administered 2024-05-22: 50 ug via INTRAVENOUS

## 2024-05-22 MED ORDER — MIDAZOLAM HCL 5 MG/5ML IJ SOLN
INTRAMUSCULAR | Status: AC
  Filled 2024-05-22: qty 5

## 2024-05-22 MED ORDER — FENTANYL CITRATE (PF) 100 MCG/2ML IJ SOLN
INTRAMUSCULAR | Status: AC
  Filled 2024-05-22: qty 2

## 2024-05-22 MED ORDER — MIDAZOLAM HCL 5 MG/5ML IJ SOLN
0.5000 mg | Freq: Once | INTRAMUSCULAR | Status: AC
  Administered 2024-05-22: 1 mg via INTRAVENOUS

## 2024-05-22 MED ORDER — LIDOCAINE HCL 2 % IJ SOLN
INTRAMUSCULAR | Status: AC
  Filled 2024-05-22: qty 20

## 2024-05-22 NOTE — Progress Notes (Signed)
 PROVIDER NOTE: Interpretation of information contained herein should be left to medically-trained personnel. Specific patient instructions are provided elsewhere under Patient Instructions section of medical record. This document was created in part using STT-dictation technology, any transcriptional errors that may result from this process are unintentional.  Patient: Clinton Thomas Type: Established DOB: 01/19/1946 MRN: [id number] PCP: Jesusa Junita POUR, MD  Service: Procedure DOS: 05/22/2024 Setting: Ambulatory Location: Ambulatory outpatient facility Delivery: Face-to-face Provider: Wallie Sherry, MD Specialty: Interventional Pain Management Specialty designation: 09 Location: Outpatient facility Ref. Prov.: Trivedi, Mehul K, MD       Interventional Therapy   Procedure: Cervical Facet Medial Branch Block(s) #1  Laterality: Bilateral  Level: C3, C4, C5, and C6 Medial Branch Level(s). Injecting these levels blocks the C3-4, C4-5, C5-6, and C6-7 cervical facet joints.  Imaging: Fluoroscopic guidance Anesthesia: Local anesthesia (1-2% Lidocaine) Sedation: Moderate Sedation                       DOS: 05/22/2024  Performed by: Wallie Sherry, MD  Purpose: Diagnostic/Therapeutic Indications: Cervicalgia (cervical spine axial pain) severe enough to impact quality of life or function. 1. Cervical spondylosis   2. Cervical facet joint syndrome   3. Cervico-occipital neuralgia    NAS-11 Pain score:   Pre-procedure: 9 /10   Post-procedure: 0-No pain/10     Position / Prep / Materials:  Position: Prone. Head in cradle. C-spine slightly flexed. Prep solution: ChloraPrep (2% chlorhexidine gluconate and 70% isopropyl alcohol) Prep Area: Posterior Cervico-thoracic Region. From occipital ridge to tip of scapula, and from shoulder to shoulder. Entire posterior and lateral neck surface. Materials:  Tray: Block Needle(s):  Type: Spinal  Gauge (G): 25  Length: 3.5-in  Qty:  3     H&P (Pre-op  Assessment):  Clinton Thomas is a 78 y.o. (year old), male patient, seen today for interventional treatment. He  has a past surgical history that includes Revision total knee arthroplasty; Back surgery; and Cervical disc surgery. Clinton Thomas has a current medication list which includes the following prescription(s): albuterol , allopurinol , amlodipine , aspirin ec, atorvastatin , benzonatate , carvedilol , chlorthalidone , diclofenac sodium, docusate sodium , fluticasone , gabapentin , guaifenesin, ketoconazole , lisinopril, memantine, cepacol regular strength, pantoprazole , sertraline , sodium bicarbonate, and trazodone . His primarily concern today is the Neck Pain  Initial Vital Signs:  Pulse/HCG Rate: 64ECG Heart Rate: (!) 58 Temp: 97.9 F (36.6 C) Resp: 14 BP: 126/71 SpO2: 100 %  BMI: Estimated body mass index is 27.71 kg/m as calculated from the following:   Height as of this encounter: 6' 1 (1.854 m).   Weight as of this encounter: 210 lb (95.3 kg).  Risk Assessment: Allergies: Reviewed. He is allergic to latanoprost and linezolid.  Allergy Precautions: None required Coagulopathies: Reviewed. None identified.  Blood-thinner therapy: None at this time Active Infection(s): Reviewed. None identified. Clinton Thomas is afebrile  Site Confirmation: Clinton Thomas was asked to confirm the procedure and laterality before marking the site Procedure checklist: Completed Consent: Before the procedure and under the influence of no sedative(s), amnesic(s), or anxiolytics, the patient was informed of the treatment options, risks and possible complications. To fulfill our ethical and legal obligations, as recommended by the American Medical Association's Code of Ethics, I have informed the patient of my clinical impression; the nature and purpose of the treatment or procedure; the risks, benefits, and possible complications of the intervention; the alternatives, including doing nothing; the risk(s) and benefit(s) of the  alternative treatment(s) or procedure(s); and the risk(s) and benefit(s) of doing  nothing. The patient was provided information about the general risks and possible complications associated with the procedure. These may include, but are not limited to: failure to achieve desired goals, infection, bleeding, organ or nerve damage, allergic reactions, paralysis, and death. In addition, the patient was informed of those risks and complications associated to Spine-related procedures, such as failure to decrease pain; infection (i.e.: Meningitis, epidural or intraspinal abscess); bleeding (i.e.: epidural hematoma, subarachnoid hemorrhage, or any other type of intraspinal or peri-dural bleeding); organ or nerve damage (i.e.: Any type of peripheral nerve, nerve root, or spinal cord injury) with subsequent damage to sensory, motor, and/or autonomic systems, resulting in permanent pain, numbness, and/or weakness of one or several areas of the body; allergic reactions; (i.e.: anaphylactic reaction); and/or death. Furthermore, the patient was informed of those risks and complications associated with the medications. These include, but are not limited to: allergic reactions (i.e.: anaphylactic or anaphylactoid reaction(s)); adrenal axis suppression; blood sugar elevation that in diabetics may result in ketoacidosis or comma; water retention that in patients with history of congestive heart failure may result in shortness of breath, pulmonary edema, and decompensation with resultant heart failure; weight gain; swelling or edema; medication-induced neural toxicity; particulate matter embolism and blood vessel occlusion with resultant organ, and/or nervous system infarction; and/or aseptic necrosis of one or more joints. Finally, the patient was informed that Medicine is not an exact science; therefore, there is also the possibility of unforeseen or unpredictable risks and/or possible complications that may result in a  catastrophic outcome. The patient indicated having understood very clearly. We have given the patient no guarantees and we have made no promises. Enough time was given to the patient to ask questions, all of which were answered to the patient's satisfaction. Mr. Rallo has indicated that he wanted to continue with the procedure. Attestation: I, the ordering provider, attest that I have discussed with the patient the benefits, risks, side-effects, alternatives, likelihood of achieving goals, and potential problems during recovery for the procedure that I have provided informed consent. Date  Time: 05/22/2024  7:45 AM  Pre-Procedure Preparation:  Monitoring: As per clinic protocol. Respiration, ETCO2, SpO2, BP, heart rate and rhythm monitor placed and checked for adequate function Safety Precautions: Patient was assessed for positional comfort and pressure points before starting the procedure. Time-out: I initiated and conducted the Time-out before starting the procedure, as per protocol. The patient was asked to participate by confirming the accuracy of the Time Out information. Verification of the correct person, site, and procedure were performed and confirmed by me, the nursing staff, and the patient. Time-out conducted as per Joint Commission's Universal Protocol (UP.01.01.01). Time: 0909 Start Time: 0910 hrs.  Description/Narrative of Procedure:          Laterality: See above. Targeted Levels: See above.  Rationale (medical necessity): procedure needed and proper for the diagnosis and/or treatment of the patient's medical symptoms and needs. Procedural Technique Safety Precautions: Aspiration looking for blood return was conducted prior to all injections. At no point did we inject any substances, as a needle was being advanced. No attempts were made at seeking any paresthesias. Safe injection practices and needle disposal techniques used. Medications properly checked for expiration dates.  SDV (single dose vial) medications used. Description of the Procedure: Protocol guidelines were followed. The patient was assisted into a comfortable position. The target area was identified and the area prepped in the usual manner. Skin & deeper tissues infiltrated with local anesthetic. Appropriate amount of time allowed  to pass for local anesthetics to take effect. The procedure needles were then advanced to the target area. Proper needle placement secured. Negative aspiration confirmed. Solution injected in intermittent fashion, asking for systemic symptoms every 0.5cc of injectate. The needles were then removed and the area cleansed, making sure to leave some of the prepping solution back to take advantage of its long term bactericidal properties.  Technical description of process:  C3 Medial Branch Nerve Block (MBB): The target area for the C3 dorsal medial articular branch is the lateral concave waist of the articular pillar of C3. Under fluoroscopic guidance, a Quincke needle was inserted until contact was made with os over the postero-lateral aspect of the articular pillar of C3 (target area). After negative aspiration for blood, 2mL of the nerve block solution was injected without difficulty or complication. The needle was removed intact. C4 Medial Branch Nerve Block (MBB): The target area for the C4 dorsal medial articular branch is the lateral concave waist of the articular pillar of C4. Under fluoroscopic guidance, a Quincke needle was inserted until contact was made with os over the postero-lateral aspect of the articular pillar of C4 (target area). After negative aspiration for blood, 2mL of the nerve block solution was injected without difficulty or complication. The needle was removed intact. C5 Medial Branch Nerve Block (MBB): The target area for the C5 dorsal medial articular branch is the lateral concave waist of the articular pillar of C5. Under fluoroscopic guidance, a Quincke needle was  inserted until contact was made with os over the postero-lateral aspect of the articular pillar of C5 (target area). After negative aspiration for blood, 2mL of the nerve block solution was injected without difficulty or complication. The needle was removed intact. C6 Medial Branch Nerve Block (MBB): The target area for the C6 dorsal medial articular branch is the lateral concave waist of the articular pillar of C6. Under fluoroscopic guidance, a Quincke needle was inserted until contact was made with os over the postero-lateral aspect of the articular pillar of C6 (target area). After negative aspiration for blood, 2mL of the nerve block solution was injected without difficulty or complication. The needle was removed intact.   Once the entire procedure was completed, the treated area was cleaned, making sure to leave some of the prepping solution back to take advantage of its long term bactericidal properties.  Anatomy Reference Guide:      Facet Joint Innervation  C1-2 Third occipital Nerve (TON)  C2-3 TON, C3  Medial Branch  C3-4 C3, C4                     C4-5 C4, C5                     C5-6 C5, C6                     C6-7 C6, C7                     C7-T1 C7, C8                      Cervical Facet Pain Pattern overlap:   Vitals:   05/22/24 0920 05/22/24 0929 05/22/24 0940 05/22/24 0955  BP: 105/79 127/79 126/65 126/80  Pulse:      Resp: 16 16 16 16   Temp:  97.8 F (36.6 C)    TempSrc:      SpO2:  100% 95% 98% 98%  Weight:      Height:         Start Time: 0910 hrs. End Time: 0921 hrs.  Imaging Guidance (Spinal):         Type of Imaging Technique: Fluoroscopy Guidance (Spinal) Indication(s): Fluoroscopy guidance for needle placement to enhance accuracy in procedures requiring precise needle localization for targeted delivery of medication in or near specific anatomical locations not easily accessible without such real-time imaging assistance. Exposure Time: Please  see nurses notes. Contrast: None used. Fluoroscopic Guidance: I was personally present during the use of fluoroscopy. Tunnel Vision Technique used to obtain the best possible view of the target area. Parallax error corrected before commencing the procedure. Direction-depth-direction technique used to introduce the needle under continuous pulsed fluoroscopy. Once target was reached, antero-posterior, oblique, and lateral fluoroscopic projection used confirm needle placement in all planes. Images permanently stored in EMR. Interpretation: No contrast injected. I personally interpreted the imaging intraoperatively. Adequate needle placement confirmed in multiple planes. Permanent images saved into the patient's record.  Post-operative Assessment:  Post-procedure Vital Signs:  Pulse/HCG Rate: (!) 5760 Temp: 97.8 F (36.6 C) Resp: 16 BP: 126/80 SpO2: 98 %  EBL: None  Complications: No immediate post-treatment complications observed by team, or reported by patient.  Note: The patient tolerated the entire procedure well. A repeat set of vitals were taken after the procedure and the patient was kept under observation following institutional policy, for this type of procedure. Post-procedural neurological assessment was performed, showing return to baseline, prior to discharge. The patient was provided with post-procedure discharge instructions, including a section on how to identify potential problems. Should any problems arise concerning this procedure, the patient was given instructions to immediately contact us , at any time, without hesitation. In any case, we plan to contact the patient by telephone for a follow-up status report regarding this interventional procedure.  Comments:  No additional relevant information.  Plan of Care (POC)  Orders:  Orders Placed This Encounter  Procedures   DG PAIN [facility] C-ARM 1-60 MIN NO REPORT    Intraoperative interpretation by procedural physician at  [facility].    Standing Status:   Standing    Number of Occurrences:   1    Reason for exam::   Assistance in needle guidance and placement for procedures requiring needle placement in or near specific anatomical locations not easily accessible without such assistance.   Medications ordered for procedure: Meds ordered this encounter  Medications   lidocaine (XYLOCAINE) 2 % (with pres) injection 400 mg   lactated ringers  infusion   midazolam (VERSED) 5 MG/5ML injection 0.5-2 mg    Make sure Flumazenil is available in the pyxis when using this medication. If oversedation occurs, administer 0.2 mg IV over 15 sec. If after 45 sec no response, administer 0.2 mg again over 1 min; may repeat at 1 min intervals; not to exceed 4 doses (1 mg)   fentaNYL (SUBLIMAZE) injection 25-50 mcg    Make sure Narcan is available in the pyxis when using this medication. In the event of respiratory depression (RR< 8/min): Titrate NARCAN (naloxone) in increments of 0.1 to 0.2 mg IV at 2-3 minute intervals, until desired degree of reversal.   ropivacaine (PF) 2 mg/mL (0.2%) (NAROPIN) injection 18 mL   dexamethasone (DECADRON) injection 20 mg   Medications administered: We administered lidocaine, lactated ringers , midazolam, fentaNYL, ropivacaine (PF) 2 mg/mL (0.2%), and dexamethasone.  See the medical record for exact dosing, route, and  time of administration.   Follow-up plan:   Return in about 4 weeks (around 06/19/2024) for PPE, F2F.     Recent Visits Date Type Provider Dept  05/07/24 Office Visit Marcelino Nurse, MD [facility]  Showing recent visits within past 90 days and meeting all other requirements Today's Visits Date Type Provider Dept  05/22/24 Procedure visit Marcelino Nurse, MD [facility]  Showing today's visits and meeting all other requirements Future Appointments Date Type Provider Dept  06/19/24 Appointment Marcelino Nurse, MD [facility]  Showing  future appointments within next 90 days and meeting all other requirements   Disposition: Discharge home  Discharge (Date  Time): 05/22/2024; 1000 hrs.   Primary Care Physician: Trivedi, Mehul K, MD Location: [facility] Note by: Nurse Marcelino, MD (TTS technology used. I apologize for any typographical errors that were not detected and corrected.) Date: 05/22/2024; Time: 10:14 AM  Disclaimer:  Medicine is not an Visual merchandiser. The only guarantee in medicine is that nothing is guaranteed. It is important to note that the decision to proceed with this intervention was based on the information collected from the patient. The Data and conclusions were drawn from the patient's questionnaire, the interview, and the physical examination. Because the information was provided in large part by the patient, it cannot be guaranteed that it has not been purposely or unconsciously manipulated. Every effort has been made to obtain as much relevant data as possible for this evaluation. It is important to note that the conclusions that lead to this procedure are derived in large part from the available data. Always take into account that the treatment will also be dependent on availability of resources and existing treatment guidelines, considered by other Pain Management Practitioners as being common knowledge and practice, at the time of the intervention. For Medico-Legal purposes, it is also important to point out that variation in procedural techniques and pharmacological choices are the acceptable norm. The indications, contraindications, technique, and results of the above procedure should only be interpreted and judged by a Board-Certified Interventional Pain Specialist with extensive familiarity and expertise in the same exact procedure and technique.

## 2024-05-22 NOTE — Patient Instructions (Addendum)
 Moderate Conscious Sedation, Adult, Care After After the procedure, it is common to have: Sleepiness for a few hours. Impaired judgment for a few hours. Trouble with balance. Nausea or vomiting if you eat too soon. Follow these instructions at home: For the time period you were told by your health care provider:  Rest. Do not participate in activities where you could fall or become injured. Do not drive or use machinery. Do not drink alcohol. Do not take sleeping pills or medicines that cause drowsiness. Do not make important decisions or sign legal documents. Do not take care of children on your own. Eating and drinking Follow instructions from your health care provider about what you may eat and drink. Drink enough fluid to keep your urine pale yellow. If you vomit: Drink clear fluids slowly and in small amounts as you are able. Clear fluids include water, ice chips, low-calorie sports drinks, and fruit juice that has water added to it (diluted fruit juice). Eat light and bland foods in small amounts as you are able. These foods include bananas, applesauce, rice, lean meats, toast, and crackers. General instructions Take over-the-counter and prescription medicines only as told by your health care provider. Have a responsible adult stay with you for the time you are told. Do not use any products that contain nicotine or tobacco. These products include cigarettes, chewing tobacco, and vaping devices, such as e-cigarettes. If you need help quitting, ask your health care provider. Return to your normal activities as told by your health care provider. Ask your health care provider what activities are safe for you. Your health care provider may give you more instructions. Make sure you know what you can and cannot do. Contact a health care provider if: You are still sleepy or having trouble with balance after 24 hours. You feel light-headed. You vomit every time you eat or drink. You get  a rash. You have a fever. You have redness or swelling around the IV site. Get help right away if: You have trouble breathing. You start to feel confused at home. These symptoms may be an emergency. Get help right away. Call 911. Do not wait to see if the symptoms will go away. Do not drive yourself to the hospital. This information is not intended to replace advice given to you by your health care provider. Make sure you discuss any questions you have with your health care provider. Document Revised: 05/23/2022 Document Reviewed: 05/23/2022 Elsevier Patient Education  2024 [employer] ______________________________________________________________________    Post-Procedure Discharge Instructions  Instructions: Apply ice:  Purpose: This will minimize any swelling and discomfort after procedure.  When: Day of procedure, as soon as you get home. How: Fill a plastic sandwich bag with crushed ice. Cover it with a small towel and apply to injection site. How long: (15 min on, 15 min off) Apply for 15 minutes then remove x 15 minutes.  Repeat sequence on day of procedure, until you go to bed. Apply heat:  Purpose: To treat any soreness and discomfort from the procedure. When: Starting the next day after the procedure. How: Apply heat to procedure site starting the day following the procedure. How long: May continue to repeat daily, until discomfort goes away. Food intake: Start with clear liquids (like water) and advance to regular food, as tolerated.  Physical activities: Keep activities to a minimum for the first 8 hours after the procedure. After that, then as tolerated. Driving: If you have received any sedation, be responsible and do  not drive. You are not allowed to drive for 24 hours after having sedation. Blood thinner: (Applies only to those taking blood thinners) You may restart your blood thinner 6 hours after your procedure. Insulin: (Applies only to Diabetic patients taking  insulin) As soon as you can eat, you may resume your normal dosing schedule. Infection prevention: Keep procedure site clean and dry. Shower daily and clean area with soap and water. Post-procedure Pain Diary: Extremely important that this be done correctly and accurately. Recorded information will be used to determine the next step in treatment. For the purpose of accuracy, follow these rules: Evaluate only the area treated. Do not report or include pain from an untreated area. For the purpose of this evaluation, ignore all other areas of pain, except for the treated area. After your procedure, avoid taking a long nap and attempting to complete the pain diary after you wake up. Instead, set your alarm clock to go off every hour, on the hour, for the initial 8 hours after the procedure. Document the duration of the numbing medicine, and the relief you are getting from it. Do not go to sleep and attempt to complete it later. It will not be accurate. If you received sedation, it is likely that you were given a medication that may cause amnesia. Because of this, completing the diary at a later time may cause the information to be inaccurate. This information is needed to plan your care. Follow-up appointment: Keep your post-procedure follow-up evaluation appointment after the procedure (usually 2 weeks for most procedures, 6 weeks for radiofrequencies). DO NOT FORGET to bring you pain diary with you.   Expect: (What should I expect to see with my procedure?) From numbing medicine (AKA: Local Anesthetics): Numbness or decrease in pain. You may also experience some weakness, which if present, could last for the duration of the local anesthetic. Onset: Full effect within 15 minutes of injected. Duration: It will depend on the type of local anesthetic used. On the average, 1 to 8 hours.  From steroids (Applies only if steroids were used): Decrease in swelling or inflammation. Once inflammation is improved,  relief of the pain will follow. Onset of benefits: Depends on the amount of swelling present. The more swelling, the longer it will take for the benefits to be seen. In some cases, up to 10 days. Duration: Steroids will stay in the system x 2 weeks. Duration of benefits will depend on multiple posibilities including persistent irritating factors. Side-effects: If present, they may typically last 2 weeks (the duration of the steroids). Frequent: Cramps (if they occur, drink Gatorade and take over-the-counter Magnesium 450-500 mg once to twice a day); water retention with temporary weight gain; increases in blood sugar; decreased immune system response; increased appetite. Occasional: Facial flushing (red, warm cheeks); mood swings; menstrual changes. Uncommon: Long-term decrease or suppression of natural hormones; bone thinning. (These are more common with higher doses or more frequent use. This is why we prefer that our patients avoid having any injection therapies in other practices.)  Very Rare: Severe mood changes; psychosis; aseptic necrosis. From procedure: Some discomfort is to be expected once the numbing medicine wears off. This should be minimal if ice and heat are applied as instructed.  Call if: (When should I call?) You experience numbness and weakness that gets worse with time, as opposed to wearing off. New onset bowel or bladder incontinence. (Applies only to procedures done in the spine)  Emergency Numbers: Durning business hours (Monday -  Thursday, 8:00 AM - 4:00 PM) (Friday, 9:00 AM - 12:00 Noon): [phone] After hours: [phone] NOTE: If you are having a problem and are unable connect with, or to talk to a provider, then go to your nearest urgent care or emergency department. If the problem is serious and urgent, please call 911. ______________________________________________________________________

## 2024-05-23 ENCOUNTER — Telehealth: Payer: Self-pay | Admitting: *Deleted

## 2024-05-23 NOTE — Telephone Encounter (Signed)
Spoke with patient's wife, no problems post procedure. 

## 2024-05-28 ENCOUNTER — Telehealth: Payer: Self-pay | Admitting: Student in an Organized Health Care Education/Training Program

## 2024-05-28 NOTE — Telephone Encounter (Signed)
 Spoke with patient's wife. He had bilateral cervical facet block on 05-22-24. Continues to have pain in neck and head. I explained that it may be expected to continue to have pain at this point. Denies fever or any neurological symptoms. Advised the use of heat to the area.

## 2024-05-28 NOTE — Telephone Encounter (Signed)
 PT wife called stated that he is having headaches and neck pain. Please give patient a call. TY

## 2024-06-19 ENCOUNTER — Ambulatory Visit: Admitting: Student in an Organized Health Care Education/Training Program

## 2024-06-20 ENCOUNTER — Ambulatory Visit
Attending: Student in an Organized Health Care Education/Training Program | Admitting: Student in an Organized Health Care Education/Training Program

## 2024-06-20 ENCOUNTER — Encounter: Payer: Self-pay | Admitting: Student in an Organized Health Care Education/Training Program

## 2024-06-20 VITALS — BP 128/63 | HR 73 | Temp 97.1°F | Resp 15 | Ht 73.0 in | Wt 226.0 lb

## 2024-06-20 DIAGNOSIS — M5412 Radiculopathy, cervical region: Secondary | ICD-10-CM | POA: Diagnosis present

## 2024-06-20 DIAGNOSIS — G894 Chronic pain syndrome: Secondary | ICD-10-CM | POA: Diagnosis present

## 2024-06-20 DIAGNOSIS — M5481 Occipital neuralgia: Secondary | ICD-10-CM | POA: Insufficient documentation

## 2024-06-20 MED ORDER — BUPRENORPHINE 5 MCG/HR TD PTWK: 1.0000 | MEDICATED_PATCH | TRANSDERMAL | 0 refills | Status: DC

## 2024-06-20 MED ORDER — BUPRENORPHINE 7.5 MCG/HR TD PTWK: 1.0000 | MEDICATED_PATCH | TRANSDERMAL | 0 refills | Status: DC

## 2024-06-20 NOTE — Progress Notes (Signed)
 Safety precautions to be maintained throughout the outpatient stay will include: orient to surroundings, keep bed in low position, maintain call bell within reach at all times, provide assistance with transfer out of bed and ambulation.

## 2024-06-20 NOTE — Progress Notes (Signed)
 PROVIDER NOTE: Interpretation of information contained herein should be left to medically-trained personnel. Specific patient instructions are provided elsewhere under Patient Instructions section of medical record. This document was created in part using AI and STT-dictation technology, any transcriptional errors that may result from this process are unintentional.  Patient: Clinton Thomas  Service: E/M   PCP: Jesusa Junita POUR, MD  DOB: May 27, 1946  DOS: 06/20/2024  Provider: Wallie Sherry, MD  MRN: [id number]  Delivery: Face-to-face  Specialty: Interventional Pain Management  Type: Established Patient  Setting: Ambulatory outpatient facility  Specialty designation: 09  Referring Prov.: Jesusa Junita POUR, MD  Location: Outpatient office facility       History of present illness (HPI) [name], a 78 y.o. year old male, is here today because of his Cervical radicular pain [M54.12]. [name] primary complain today is Neck Pain    Pain Assessment: Severity of Chronic pain is reported as a 8 /10. Location: Neck  /shoulders bilat and back of head. Onset: More than a month ago. Quality: Aching, Discomfort, Sharp, Nagging. Timing: Constant. Modifying factor(s): meds, rest. Vitals:  height is 6' 1 (1.854 m) and weight is 226 lb (102.5 kg). His temperature is 97.1 F (36.2 C) (abnormal). His blood pressure is 128/63 and his pulse is 73. His respiration is 15 and oxygen saturation is 99%.  BMI: Estimated body mass index is 29.82 kg/m as calculated from the following:   Height as of this encounter: 6' 1 (1.854 m).   Weight as of this encounter: 226 lb (102.5 kg).  Last encounter: 05/07/2024. Last procedure: 05/22/2024.  Reason for encounter:  Post-Procedure Evaluation   Procedure: Cervical Facet Medial Branch Block(s) #1  Laterality: Bilateral  Level: C3, C4, C5, and C6 Medial Branch Level(s). Injecting these levels blocks the C3-4, C4-5, C5-6, and C6-7 cervical facet joints.  Imaging:  Fluoroscopic guidance Anesthesia: Local anesthesia (1-2% Lidocaine ) Sedation: Moderate Sedation                       DOS: 05/22/2024  Performed by: Wallie Sherry, MD  Purpose: Diagnostic/Therapeutic Indications: Cervicalgia (cervical spine axial pain) severe enough to impact quality of life or function. 1. Cervical spondylosis   2. Cervical facet joint syndrome   3. Cervico-occipital neuralgia    NAS-11 Pain score:   Pre-procedure: 9 /10   Post-procedure: 0-No pain/10     Effectiveness:  Initial hour after procedure: 100 %  Subsequent 4-6 hours post-procedure: 25 %  Analgesia past initial 6 hours: 20 % (At best, 20% improvement on some mornings)  Ongoing improvement:  Analgesic:  20%   Pharmacotherapy Assessment   Initiate Butrans  patch today.  Patient has not responded well to previous opioid trials including hydrocodone or oxycodone which resulted in sedation Monitoring: [place] PMP: PDMP not reviewed this encounter.       Pharmacotherapy: No side-effects or adverse reactions reported. Compliance: No problems identified. Effectiveness: Clinically acceptable.  Dayna Pulling, RN  06/20/2024  1:49 PM  Sign when Signing Visit Safety precautions to be maintained throughout the outpatient stay will include: orient to surroundings, keep bed in low position, maintain call bell within reach at all times, provide assistance with transfer out of bed and ambulation.   UDS:  No results found for: SUMMARY  No results found for: CBDTHCR No results found for: D8THCCBX No results found for: D9THCCBX  ROS  Constitutional: Denies any fever or chills Gastrointestinal: No reported hemesis, hematochezia, vomiting, or acute GI distress  Musculoskeletal: Cervical pain Neurological: Occipital neuralgia  Medication Review  albuterol , allopurinol , amLODipine , aspirin EC, atorvastatin , benzonatate , buprenorphine , carvedilol , chlorthalidone , diclofenac Sodium, docusate sodium , fluticasone ,  gabapentin , guaifenesin, ketoconazole , lisinopril, memantine, menthol-cetylpyridinium, pantoprazole , sertraline , sodium bicarbonate, and traZODone   History Review  Allergy: [name] is allergic to latanoprost and linezolid. Drug: [name]  reports no history of drug use. Alcohol:  reports that he does not currently use alcohol. Tobacco:  reports that he has quit smoking. He has never used smokeless tobacco. Social: [name]  reports that he has quit smoking. He has never used smokeless tobacco. He reports that he does not currently use alcohol. He reports that he does not use drugs. Medical:  has a past medical history of Atrial flutter (HCC), Colon polyp, Gout, Hepatitis C, echocardiogram, Hypertension, Microcytic anemia, Peripheral neuropathy, and Thrombocytopenia (HCC). Surgical: Mr. Urbas  has a past surgical history that includes Revision total knee arthroplasty; Back surgery; and Cervical disc surgery. Family: family history includes Arthritis in his sister; COPD in his father; Stroke in his mother.  Laboratory Chemistry Profile   Renal Lab Results  Component Value Date   BUN 31 (H) 02/28/2019   CREATININE 1.78 (H) 02/28/2019   GFRAA 43 (L) 02/28/2019   GFRNONAA 37 (L) 02/28/2019    Hepatic Lab Results  Component Value Date   AST 26 02/27/2019   ALT 22 02/27/2019   ALBUMIN 3.9 02/27/2019   ALKPHOS 64 02/27/2019   LIPASE 66 (H) 02/27/2018    Electrolytes Lab Results  Component Value Date   NA 140 02/28/2019   K 4.3 02/28/2019   CL 110 02/28/2019   CALCIUM  9.4 02/28/2019   MG 2.1 02/27/2019    Bone No results found for: VD25OH, VD125OH2TOT, CI6874NY7, CI7874NY7, 25OHVITD1, 25OHVITD2, 25OHVITD3, TESTOFREE, TESTOSTERONE  Inflammation (CRP: Acute Phase) (ESR: Chronic Phase) Lab Results  Component Value Date   ESRSEDRATE 52 (H) 10/04/2010         Note: Above Lab results reviewed.   Physical Exam  Vitals: BP 128/63   Pulse 73   Temp (!)  97.1 F (36.2 C)   Resp 15   Ht 6' 1 (1.854 m)   Wt 226 lb (102.5 kg)   SpO2 99%   BMI 29.82 kg/m  BMI: Estimated body mass index is 29.82 kg/m as calculated from the following:   Height as of this encounter: 6' 1 (1.854 m).   Weight as of this encounter: 226 lb (102.5 kg). Ideal: Ideal body weight: 79.9 kg (176 lb 2.4 oz) Adjusted ideal body weight: 88.9 kg (196 lb 1.4 oz) General appearance: Well nourished, well developed, and well hydrated. In no apparent acute distress Mental status: Alert, oriented x 3 (person, place, & time)       Respiratory: No evidence of acute respiratory distress Eyes: PERLA Cervical Spine Area Exam  Skin & Axial Inspection: No masses, redness, edema, swelling, or associated skin lesions Alignment: Symmetrical Functional ROM: Pain restricted ROM, to the right Stability: No instability detected Muscle Tone/Strength: Functionally intact. No obvious neuro-muscular anomalies detected. Sensory (Neurological): Musculoskeletal pain pattern Palpation: No palpable anomalies             Upper Extremity (UE) Exam      Side: Right upper extremity   Side: Left upper extremity  Skin & Extremity Inspection: Skin color, temperature, and hair growth are WNL. No peripheral edema or cyanosis. No masses, redness, swelling, asymmetry, or associated skin lesions. No contractures.   Skin & Extremity Inspection: Skin color, temperature,  and hair growth are WNL. No peripheral edema or cyanosis. No masses, redness, swelling, asymmetry, or associated skin lesions. No contractures.  Functional ROM: Unrestricted ROM           Functional ROM: Unrestricted ROM          Muscle Tone/Strength: Functionally intact. No obvious neuro-muscular anomalies detected.   Muscle Tone/Strength: Functionally intact. No obvious neuro-muscular anomalies detected.  Sensory (Neurological): Unimpaired           Sensory (Neurological): Unimpaired          Palpation: No palpable anomalies                Palpation: No palpable anomalies              Provocative Test(s):  Phalen's test: deferred Tinel's test: deferred Apley's scratch test (touch opposite shoulder):  Action 1 (Across chest): deferred Action 2 (Overhead): deferred Action 3 (LB reach): deferred     Provocative Test(s):  Phalen's test: deferred Tinel's test: deferred Apley's scratch test (touch opposite shoulder):  Action 1 (Across chest): deferred Action 2 (Overhead): deferred Action 3 (LB reach): deferred       Assessment   Diagnosis  1. Cervical radicular pain   2. Cervico-occipital neuralgia   3. Chronic pain syndrome      Plan of Care  1. Cervical radicular pain (Primary) - buprenorphine  (BUTRANS ) 5 MCG/HR PTWK; Place 1 patch onto the skin once a week for 28 days.  Dispense: 4 patch; Refill: 0 - buprenorphine  (BUTRANS ) 7.5 MCG/HR; Place 1 patch onto the skin once a week for 28 days.  Dispense: 4 patch; Refill: 0 - Cervical Epidural Injection; Future  2. Cervico-occipital neuralgia - buprenorphine  (BUTRANS ) 5 MCG/HR PTWK; Place 1 patch onto the skin once a week for 28 days.  Dispense: 4 patch; Refill: 0 - buprenorphine  (BUTRANS ) 7.5 MCG/HR; Place 1 patch onto the skin once a week for 28 days.  Dispense: 4 patch; Refill: 0 - GREATER OCCIPITAL NERVE BLOCK; Future  3. Chronic pain syndrome - buprenorphine  (BUTRANS ) 5 MCG/HR PTWK; Place 1 patch onto the skin once a week for 28 days.  Dispense: 4 patch; Refill: 0 - buprenorphine  (BUTRANS ) 7.5 MCG/HR; Place 1 patch onto the skin once a week for 28 days.  Dispense: 4 patch; Refill: 0 - GREATER OCCIPITAL NERVE BLOCK; Future - Cervical Epidural Injection; Future    Clinton Thomas has a current medication list which includes the following long-term medication(s): albuterol , allopurinol , atorvastatin , carvedilol , chlorthalidone , fluticasone , gabapentin , memantine, pantoprazole , and sertraline .  Pharmacotherapy (Medications Ordered): Meds ordered this  encounter  Medications   buprenorphine  (BUTRANS ) 5 MCG/HR PTWK    Sig: Place 1 patch onto the skin once a week for 28 days.    Dispense:  4 patch    Refill:  0    Chronic Pain: STOP Act (Not applicable) Fill 1 day early if closed on refill date. Avoid benzodiazepines within 8 hours of opioids   buprenorphine  (BUTRANS ) 7.5 MCG/HR    Sig: Place 1 patch onto the skin once a week for 28 days.    Dispense:  4 patch    Refill:  0    Chronic Pain: STOP Act (Not applicable) Fill 1 day early if closed on refill date. Avoid benzodiazepines within 8 hours of opioids   Orders:  Orders Placed This Encounter  Procedures   GREATER OCCIPITAL NERVE BLOCK    Standing Status:   Future    Expected Date:   07/03/2024  Expiration Date:   06/20/2025    Scheduling Instructions:     Procedure: Occipital nerve block     Laterality: Bilateral     Sedation: Patient's choice.     Timeframe: ASAA    Where will this procedure be performed?:   [facility]   Cervical Epidural Injection    Sedation: Patient's choice. Purpose: Diagnostic/Therapeutic Indication(s): Radiculitis and cervicalgia associater with cervical degenerative disc disease.    Standing Status:   Future    Expiration Date:   09/20/2024    Scheduling Instructions:     Procedure: Cervical Epidural Steroid Injection/Block     Level(s): C7-T1     Laterality: TBD     Timeframe: As soon as schedule allows.    Where will this procedure be performed?:   [facility]             Clinton Thomas    Return in about 13 days (around 07/03/2024) for C-ESI + B/L Occipital NB, in clinic [facility].    Recent Visits Date Type Provider Dept  05/22/24 Procedure visit Marcelino Nurse, MD [facility]  05/07/24 Office Visit Marcelino Nurse, MD [facility]  Showing recent visits within past 90 days and meeting all other requirements Today's Visits Date Type Provider Dept  06/20/24 Office Visit Marcelino Nurse, MD [facility]   Showing today's visits and meeting all other requirements Future Appointments Date Type Provider Dept  07/03/24 Appointment Marcelino Nurse, MD [facility]  Showing future appointments within next 90 days and meeting all other requirements  I discussed the assessment and treatment plan with the patient. The patient was provided an opportunity to ask questions and all were answered. The patient agreed with the plan and demonstrated an understanding of the instructions.  Patient advised to call back or seek an in-person evaluation if the symptoms or condition worsens.  Duration of encounter: .  Total time on encounter, as per AMA guidelines included both the face-to-face and non-face-to-face time personally spent by the physician and/or other qualified health care professional(s) on the day of the encounter (includes time in activities that require the physician or other qualified health care professional and does not include time in activities normally performed by clinical staff). Physician's time may include the following activities when performed: Preparing to see the patient (e.g., pre-charting review of records, searching for previously ordered imaging, lab work, and nerve conduction tests) Review of prior analgesic pharmacotherapies. Reviewing PMP Interpreting ordered tests (e.g., lab work, imaging, nerve conduction tests) Performing post-procedure evaluations, including interpretation of diagnostic procedures Obtaining and/or reviewing separately obtained history Performing a medically appropriate examination and/or evaluation Counseling and educating the patient/family/caregiver Ordering medications, tests, or procedures Referring and communicating with other health care professionals (when not separately reported) Documenting clinical information in the electronic or other health record Independently interpreting results (not separately reported) and communicating  results to the patient/ family/caregiver Care coordination (not separately reported)  Note by: Nurse Marcelino, MD (TTS and AI technology used. I apologize for any typographical errors that were not detected and corrected.) Date: 06/20/2024; Time: 3:03 PM

## 2024-06-20 NOTE — Patient Instructions (Addendum)
 Moderate Conscious Sedation, Adult Sedation is the use of medicines to help you relax and not feel pain. Moderate conscious sedation is a type of sedation that makes you less alert than normal. You are still able to respond to instructions, touch, or both. This type of sedation is used during short medical and dental procedures. It is milder than deep sedation, which is a type of sedation you cannot be easily woken up from. It is also milder than general anesthesia, which is the use of medicines to make you fall asleep. Moderate conscious sedation lets you return to your normal activities sooner. Tell a health care provider about: Any allergies you have. All medicines you are taking, including vitamins, herbs, steroids, eye drops, creams, and over-the-counter medicines. Any problems you or family members have had with anesthesia. Any bleeding problems you have. Any surgeries you have had. Any medical conditions you have. Whether you are pregnant or may be pregnant. Any recent alcohol, tobacco, or drug use. What are the risks? Your health care provider will talk with you about risks. These may include: Oversedation. This is when you get too much medicine. Nausea or vomiting. Allergic reaction to medicines. Trouble breathing. If this happens, a breathing tube may be used. It will be removed when you can breathe better on your own. Heart trouble. Lung trouble. Emergence delirium. This is when you feel confused while the sedation wears off. This gets better with time. What happens before the procedure? When to stop eating and drinking Follow instructions from your health care provider about what you may eat and drink. These may include: 8 hours before your procedure Stop eating most foods. Do not eat meat, fried foods, or fatty foods. Eat only light foods, such as toast or crackers. All liquids are okay except energy drinks and alcohol. 6 hours before your procedure Stop eating. Drink only  clear liquids, such as water, clear fruit juice, black coffee, plain tea, and sports drinks. Do not drink energy drinks or alcohol. 2 hours before your procedure Stop drinking all liquids. You may be allowed to take medicines with small sips of water. If you do not follow your health care provider's instructions, your procedure may be delayed or canceled. Medicines Ask your health care provider about: Changing or stopping your regular medicines. These include any diabetes medicines or blood thinners you take. Taking medicines such as aspirin and ibuprofen . These medicines can thin your blood. Do not take them unless your health care provider tells you to. Taking over-the-counter medicines, vitamins, herbs, and supplements. Tests and exams You may have an exam or testing. You may have a blood or urine sample taken. General instructions Do not use any products that contain nicotine or tobacco for at least 4 weeks before the procedure. These products include cigarettes, chewing tobacco, and vaping devices, such as e-cigarettes. If you need help quitting, ask your health care provider. If you will be going home right after the procedure, plan to have a responsible adult: Take you home from the hospital or clinic. You will not be allowed to drive. Care for you for the time you are told. What happens during the procedure?  You will be given the sedative. It may be given: As a pill you can take by mouth. It can also be put into the rectum. As a spray through the nose. As an injection into muscle. As an injection into a vein through an IV. You may be given oxygen as needed. Your blood pressure, heart  rate, breathing rate, and blood oxygen level will be monitored during the procedure. The medical or dental procedure will be done. The procedure may vary among health care providers and hospitals. What happens after the procedure? Your blood pressure, heart rate, breathing rate, and blood oxygen  level will be monitored until you leave the hospital or clinic. You will get fluids through an IV as needed. Do not drive or operate machinery until your health care provider says that it is safe. This information is not intended to replace advice given to you by your health care provider. Make sure you discuss any questions you have with your health care provider. Document Revised: 05/23/2022 Document Reviewed: 05/23/2022 Elsevier Patient Education  2024 [employer]GENERAL RISKS AND COMPLICATIONS  What are the risk, side effects and possible complications? Generally speaking, most procedures are safe.  However, with any procedure there are risks, side effects, and the possibility of complications.  The risks and complications are dependent upon the sites that are lesioned, or the type of nerve block to be performed.  The closer the procedure is to the spine, the more serious the risks are.  Great care is taken when placing the radio frequency needles, block needles or lesioning probes, but sometimes complications can occur. Infection: Any time there is an injection through the skin, there is a risk of infection.  This is why sterile conditions are used for these blocks.  There are four possible types of infection. Localized skin infection. Central Nervous System Infection-This can be in the form of Meningitis, which can be deadly. Epidural Infections-This can be in the form of an epidural abscess, which can cause pressure inside of the spine, causing compression of the spinal cord with subsequent paralysis. This would require an emergency surgery to decompress, and there are no guarantees that the patient would recover from the paralysis. Discitis-This is an infection of the intervertebral discs.  It occurs in about 1% of discography procedures.  It is difficult to treat and it may lead to surgery.        2. Pain: the needles have to go through skin and soft tissues, will cause soreness.        3. Damage to internal structures:  The nerves to be lesioned may be near blood vessels or    other nerves which can be potentially damaged.       4. Bleeding: Bleeding is more common if the patient is taking blood thinners such as  aspirin, Coumadin, Ticiid, Plavix, etc., or if he/she have some genetic predisposition  such as hemophilia. Bleeding into the spinal canal can cause compression of the spinal  cord with subsequent paralysis.  This would require an emergency surgery to  decompress and there are no guarantees that the patient would recover from the  paralysis.       5. Pneumothorax:  Puncturing of a lung is a possibility, every time a needle is introduced in  the area of the chest or upper back.  Pneumothorax refers to free air around the  collapsed lung(s), inside of the thoracic cavity (chest cavity).  Another two possible  complications related to a similar event would include: Hemothorax and Chylothorax.   These are variations of the Pneumothorax, where instead of air around the collapsed  lung(s), you may have blood or chyle, respectively.       6. Spinal headaches: They may occur with any procedures in the area of the spine.       7.  Persistent CSF (Cerebro-Spinal Fluid) leakage: This is a rare problem, but may occur  with prolonged intrathecal or epidural catheters either due to the formation of a fistulous  track or a dural tear.       8. Nerve damage: By working so close to the spinal cord, there is always a possibility of  nerve damage, which could be as serious as a permanent spinal cord injury with  paralysis.       9. Death:  Although rare, severe deadly allergic reactions known as "Anaphylactic  reaction" can occur to any of the medications used.      10. Worsening of the symptoms:  We can always make thing worse.  What are the chances of something like this happening? Chances of any of this occuring are extremely low.  By statistics, you have more of a chance of getting killed in a  motor vehicle accident: while driving to the hospital than any of the above occurring .  Nevertheless, you should be aware that they are possibilities.  In general, it is similar to taking a shower.  Everybody knows that you can slip, hit your head and get killed.  Does that mean that you should not shower again?  Nevertheless always keep in mind that statistics do not mean anything if you happen to be on the wrong side of them.  Even if a procedure has a 1 (one) in a 1,000,000 (million) chance of going wrong, it you happen to be that one..Also, keep in mind that by statistics, you have more of a chance of having something go wrong when taking medications.  Who should not have this procedure? If you are on a blood thinning medication (e.g. Coumadin, Plavix, see list of "Blood Thinners"), or if you have an active infection going on, you should not have the procedure.  If you are taking any blood thinners, please inform your physician.  How should I prepare for this procedure? Do not eat or drink anything at least six hours prior to the procedure. Bring a driver with you .  It cannot be a taxi. Come accompanied by an adult that can drive you back, and that is strong enough to help you if your legs get weak or numb from the local anesthetic. Take all of your medicines the morning of the procedure with just enough water to swallow them. If you have diabetes, make sure that you are scheduled to have your procedure done first thing in the morning, whenever possible. If you have diabetes, take only half of your insulin dose and notify our nurse that you have done so as soon as you arrive at the clinic. If you are diabetic, but only take blood sugar pills (oral hypoglycemic), then do not take them on the morning of your procedure.  You may take them after you have had the procedure. Do not take aspirin or any aspirin-containing medications, at least eleven (11) days prior to the procedure.  They may prolong  bleeding. Wear loose fitting clothing that may be easy to take off and that you would not mind if it got stained with Betadine or blood. Do not wear any jewelry or perfume Remove any nail coloring.  It will interfere with some of our monitoring equipment.  NOTE: Remember that this is not meant to be interpreted as a complete list of all possible complications.  Unforeseen problems may occur.  BLOOD THINNERS The following drugs contain aspirin or other products, which can cause increased  bleeding during surgery and should not be taken for 2 weeks prior to and 1 week after surgery.  If you should need take something for relief of minor pain, you may take acetaminophen  which is found in Tylenol ,m Datril, Anacin-3 and Panadol. It is not blood thinner. The products listed below are.  Do not take any of the products listed below in addition to any listed on your instruction sheet.  A.P.C or A.P.C with Codeine Codeine Phosphate Capsules #3 Ibuprofen  Ridaura  ABC compound Congesprin Imuran rimadil  Advil  Cope Indocin Robaxisal  Alka-Seltzer Effervescent Pain Reliever and Antacid Coricidin or Coricidin-D  Indomethacin Rufen   Alka-Seltzer plus Cold Medicine Cosprin Ketoprofen S-A-C Tablets  Anacin Analgesic Tablets or Capsules Coumadin Korlgesic Salflex  Anacin Extra Strength Analgesic tablets or capsules CP-2 Tablets Lanoril Salicylate  Anaprox Cuprimine Capsules Levenox Salocol  Anexsia-D Dalteparin Magan Salsalate  Anodynos Darvon compound Magnesium Salicylate Sine-off  Ansaid Dasin Capsules Magsal Sodium Salicylate  Anturane Depen Capsules Marnal Soma  APF Arthritis pain formula Dewitt's Pills Measurin Stanback  Argesic Dia-Gesic Meclofenamic Sulfinpyrazone  Arthritis Bayer Timed Release Aspirin Diclofenac Meclomen Sulindac  Arthritis pain formula Anacin Dicumarol Medipren  Supac  Analgesic (Safety coated) Arthralgen Diffunasal Mefanamic Suprofen  Arthritis Strength Bufferin Dihydrocodeine Mepro  Compound Suprol  Arthropan liquid Dopirydamole Methcarbomol with Aspirin Synalgos  ASA tablets/Enseals Disalcid Micrainin Tagament  Ascriptin Doan's Midol  Talwin  Ascriptin A/D Dolene Mobidin Tanderil  Ascriptin Extra Strength Dolobid Moblgesic Ticlid  Ascriptin with Codeine Doloprin or Doloprin with Codeine Momentum Tolectin  Asperbuf Duoprin Mono-gesic Trendar  Aspergum Duradyne Motrin  or Motrin  IB Triminicin  Aspirin plain, buffered or enteric coated Durasal Myochrisine Trigesic  Aspirin Suppositories Easprin Nalfon Trillsate  Aspirin with Codeine Ecotrin Regular or Extra Strength Naprosyn Uracel  Atromid-S Efficin Naproxen Ursinus  Auranofin Capsules Elmiron Neocylate Vanquish  Axotal Emagrin Norgesic Verin  Azathioprine Empirin or Empirin with Codeine Normiflo Vitamin E  Azolid Emprazil Nuprin  Voltaren  Bayer Aspirin plain, buffered or children's or timed BC Tablets or powders Encaprin Orgaran Warfarin Sodium  Buff-a-Comp Enoxaparin  Orudis Zorpin  Buff-a-Comp with Codeine Equegesic Os-Cal-Gesic   Buffaprin Excedrin plain, buffered or Extra Strength Oxalid   Bufferin Arthritis Strength Feldene Oxphenbutazone   Bufferin plain or Extra Strength Feldene Capsules Oxycodone  with Aspirin   Bufferin with Codeine Fenoprofen Fenoprofen Pabalate or Pabalate-SF   Buffets II Flogesic Panagesic   Buffinol plain or Extra Strength Florinal or Florinal with Codeine Panwarfarin   Buf-Tabs Flurbiprofen Penicillamine   Butalbital Compound Four-way cold tablets Penicillin   Butazolidin Fragmin Pepto-Bismol   Carbenicillin Geminisyn Percodan   Carna Arthritis Reliever Geopen Persantine   Carprofen Gold's salt Persistin   Chloramphenicol Goody's Phenylbutazone   Chloromycetin Haltrain Piroxlcam   Clmetidine heparin  Plaquenil   Cllnoril Hyco-pap Ponstel   Clofibrate Hydroxy chloroquine Propoxyphen         Before stopping any of these medications, be sure to consult the physician who ordered them.   Some, such as Coumadin (Warfarin) are ordered to prevent or treat serious conditions such as "deep thrombosis", "pumonary embolisms", and other heart problems.  The amount of time that you may need off of the medication may also vary with the medication and the reason for which you were taking it.  If you are taking any of these medications, please make sure you notify your pain physician before you undergo any procedures.         Occipital Nerve Block Patient Information  Description: The occipital nerves originate in the cervical (neck)  spinal cord and travel upward through muscle and tissue to supply sensation to the back of the head and top of the scalp.  In addition, the nerves control some of the muscles of the scalp.  Occipital neuralgia is an irritation of these nerves which can cause headaches, numbness of the scalp, and neck discomfort.     The occipital nerve block will interrupt nerve transmission through these nerves and can relieve pain and spasm.  The block consists of insertion of a small needle under the skin in the back of the head to deposit local anesthetic (numbing medicine) and/or steroids around the nerve.  The entire block usually lasts less than 5 minutes.  Conditions which may be treated by occipital blocks:  Muscular pain and spasm of the scalp Nerve irritation, back of the head Headaches Upper neck pain  Preparation for the injection:  Do not eat any solid food or dairy products within 8 hours of your appointment. You may drink clear liquids up to 3 hours before appointment.  Clear liquids include water, black coffee, juice or soda.  No milk or cream please. You may take your regular medication, including pain medications, with a sip of water before you appointment.  Diabetics should hold regular insulin (if taken separately) and take 1/2 normal NPH dose the morning of the procedure.  Carry some sugar containing items with you to your appointment. A driver must  accompany you and be prepared to drive you home after your procedure. Bring all your current medications with you. An IV may be inserted and sedation may be given at the discretion of the physician. A blood pressure cuff, EKG, and other monitors will often be applied during the procedure.  Some patients may need to have extra oxygen administered for a short period. You will be asked to provide medical information, including your allergies and medications, prior to the procedure.  We must know immediately if you are taking blood thinners (like Coumadin/Warfarin) or if you are allergic to IV iodine contrast (dye).  We must know if you could possible be pregnant.  Do not wear a high collared shirt or turtleneck.  Tie long hair up in the back if possible.  Possible side-effects:  Bleeding from needle site Infection (rare, may require surgery) Nerve injury (rare) Hair on back of neck can be tinged with iodine scrub (this will wash out) Light-headedness (temporary) Pain at injection site (several days) Decreased blood pressure (rare, temporary) Seizure (very rare)  Call if you experience:  Hives or difficulty breathing ( go to the emergency room) Inflammation or drainage at the injection site(s)  Please note:  Although the local anesthetic injected can often make your painful muscles or headache feel good for several hours after the injection, the pain may return.  It takes 3-7 days for steroids to work.  You may not notice any pain relief for at least one week.  If effective, we will often do a series of injections spaced 3-6 weeks apart to maximally decrease your pain.  If you have any questions, please call [phone] [facility] Epidural Steroid Injection Patient Information  Description: The epidural space surrounds the nerves as they exit the spinal cord.  In some patients, the nerves can be compressed and inflamed by a bulging disc or a tight  spinal canal (spinal stenosis).  By injecting steroids into the epidural space, we can bring irritated nerves into direct contact with a potentially helpful medication.  These  steroids act directly on the irritated nerves and can reduce swelling and inflammation which often leads to decreased pain.  Epidural steroids may be injected anywhere along the spine and from the neck to the low back depending upon the location of your pain.   After numbing the skin with local anesthetic (like Novocaine), a small needle is passed into the epidural space slowly.  You may experience a sensation of pressure while this is being done.  The entire block usually last less than 10 minutes.  Conditions which may be treated by epidural steroids:  Low back and leg pain Neck and arm pain Spinal stenosis Post-laminectomy syndrome Herpes zoster (shingles) pain Pain from compression fractures  Preparation for the injection:  Do not eat any solid food or dairy products within 8 hours of your appointment.  You may drink clear liquids up to 3 hours before appointment.  Clear liquids include water, black coffee, juice or soda.  No milk or cream please. You may take your regular medication, including pain medications, with a sip of water before your appointment  Diabetics should hold regular insulin (if taken separately) and take 1/2 normal NPH dos the morning of the procedure.  Carry some sugar containing items with you to your appointment. A driver must accompany you and be prepared to drive you home after your procedure.  Bring all your current medications with your. An IV may be inserted and sedation may be given at the discretion of the physician.   A blood pressure cuff, EKG and other monitors will often be applied during the procedure.  Some patients may need to have extra oxygen administered for a short period. You will be asked to provide medical information, including your allergies, prior to the procedure.  We  must know immediately if you are taking blood thinners (like Coumadin/Warfarin)  Or if you are allergic to IV iodine contrast (dye). We must know if you could possible be pregnant.  Possible side-effects: Bleeding from needle site Infection (rare, may require surgery) Nerve injury (rare) Numbness & tingling (temporary) Difficulty urinating (rare, temporary) Spinal headache ( a headache worse with upright posture) Light -headedness (temporary) Pain at injection site (several days) Decreased blood pressure (temporary) Weakness in arm/leg (temporary) Pressure sensation in back/neck (temporary)  Call if you experience: Fever/chills associated with headache or increased back/neck pain. Headache worsened by an upright position. New onset weakness or numbness of an extremity below the injection site Hives or difficulty breathing (go to the emergency room) Inflammation or drainage at the infection site Severe back/neck pain Any new symptoms which are concerning to you  Please note:  Although the local anesthetic injected can often make your back or neck feel good for several hours after the injection, the pain will likely return.  It takes 3-7 days for steroids to work in the epidural space.  You may not notice any pain relief for at least that one week.  If effective, we will often do a series of three injections spaced 3-6 weeks apart to maximally decrease your pain.  After the initial series, we generally will wait several months before considering a repeat injection of the same type.  If you have any questions, please call [phone] [facility]

## 2024-06-24 ENCOUNTER — Other Ambulatory Visit: Payer: Self-pay | Admitting: *Deleted

## 2024-06-24 ENCOUNTER — Telehealth: Payer: Self-pay | Admitting: Nurse Practitioner

## 2024-06-24 ENCOUNTER — Telehealth: Payer: Self-pay | Admitting: Student in an Organized Health Care Education/Training Program

## 2024-06-24 DIAGNOSIS — G894 Chronic pain syndrome: Secondary | ICD-10-CM

## 2024-06-24 DIAGNOSIS — M5481 Occipital neuralgia: Secondary | ICD-10-CM

## 2024-06-24 DIAGNOSIS — M5412 Radiculopathy, cervical region: Secondary | ICD-10-CM

## 2024-06-24 MED ORDER — BUPRENORPHINE 7.5 MCG/HR TD PTWK
1.0000 | MEDICATED_PATCH | TRANSDERMAL | 0 refills | Status: DC
Start: 2024-07-18 — End: 2024-08-07

## 2024-06-24 MED ORDER — BUPRENORPHINE 5 MCG/HR TD PTWK: 1.0000 | MEDICATED_PATCH | TRANSDERMAL | 0 refills | Status: AC

## 2024-06-24 NOTE — Telephone Encounter (Signed)
 Called and let patient's wife know that Rx's have been sent in to [facility].

## 2024-06-24 NOTE — Telephone Encounter (Signed)
 Patients Wife called stating the script Dr Marcelino sent if for patient needs to be sent to [facility] PLEASE.

## 2024-06-24 NOTE — Telephone Encounter (Signed)
 Called Leslie at [facility] to verify Rx's for butrans  patches and to fill 5mcg/h patches first and if patient tolerates he will go up to 7.5 mcg/h on the next Rx.  Spoke with patient's wife this morning and she is aware of this regimen

## 2024-06-24 NOTE — Telephone Encounter (Signed)
 Sonny from [facility] wants to verify the scripts sent in for Mr Vanhook. Please call her at  [phone] ex [phone]

## 2024-07-03 ENCOUNTER — Ambulatory Visit (HOSPITAL_BASED_OUTPATIENT_CLINIC_OR_DEPARTMENT_OTHER): Admitting: Student in an Organized Health Care Education/Training Program

## 2024-07-03 ENCOUNTER — Encounter: Payer: Self-pay | Admitting: Student in an Organized Health Care Education/Training Program

## 2024-07-03 ENCOUNTER — Ambulatory Visit
Admission: RE | Admit: 2024-07-03 | Discharge: 2024-07-03 | Disposition: A | Discharge status: Home / Self Care | Source: Ambulatory Visit | Attending: Student in an Organized Health Care Education/Training Program | Admitting: Student in an Organized Health Care Education/Training Program

## 2024-07-03 VITALS — BP 121/69 | HR 61 | Temp 97.6°F | Resp 17 | Ht 73.0 in | Wt 226.0 lb

## 2024-07-03 DIAGNOSIS — G894 Chronic pain syndrome: Secondary | ICD-10-CM | POA: Insufficient documentation

## 2024-07-03 DIAGNOSIS — M5481 Occipital neuralgia: Secondary | ICD-10-CM | POA: Diagnosis present

## 2024-07-03 DIAGNOSIS — M5412 Radiculopathy, cervical region: Secondary | ICD-10-CM | POA: Insufficient documentation

## 2024-07-03 MED ORDER — DEXAMETHASONE SODIUM PHOSPHATE 10 MG/ML IJ SOLN
INTRAMUSCULAR | Status: AC
  Filled 2024-07-03: qty 2

## 2024-07-03 MED ORDER — IOHEXOL 180 MG/ML  SOLN
10.0000 mL | Freq: Once | INTRAMUSCULAR | Status: AC
  Administered 2024-07-03 (×2): 10 mL via EPIDURAL

## 2024-07-03 MED ORDER — DEXAMETHASONE SODIUM PHOSPHATE 10 MG/ML IJ SOLN
10.0000 mg | Freq: Once | INTRAMUSCULAR | Status: AC
  Administered 2024-07-03 (×2): 10 mg

## 2024-07-03 MED ORDER — ROPIVACAINE HCL 2 MG/ML IJ SOLN
1.0000 mL | Freq: Once | INTRAMUSCULAR | Status: AC
  Administered 2024-07-03 (×2): 1 mL via EPIDURAL

## 2024-07-03 MED ORDER — LIDOCAINE HCL (PF) 2 % IJ SOLN
INTRAMUSCULAR | Status: AC
  Filled 2024-07-03: qty 5

## 2024-07-03 MED ORDER — ROPIVACAINE HCL 2 MG/ML IJ SOLN
INTRAMUSCULAR | Status: AC
  Filled 2024-07-03: qty 20

## 2024-07-03 MED ORDER — MIDAZOLAM HCL 2 MG/2ML IJ SOLN
INTRAMUSCULAR | Status: AC
  Filled 2024-07-03: qty 2

## 2024-07-03 MED ORDER — IOHEXOL 180 MG/ML  SOLN
INTRAMUSCULAR | Status: AC
  Filled 2024-07-03: qty 20

## 2024-07-03 MED ORDER — MIDAZOLAM HCL 2 MG/2ML IJ SOLN
0.5000 mg | Freq: Once | INTRAMUSCULAR | Status: AC
  Administered 2024-07-03 (×2): 2 mg via INTRAVENOUS

## 2024-07-03 MED ORDER — SODIUM CHLORIDE 0.9% FLUSH
1.0000 mL | Freq: Once | INTRAVENOUS | Status: AC
  Administered 2024-07-03 (×2): 1 mL

## 2024-07-03 MED ORDER — LACTATED RINGERS IV SOLN: Freq: Once | INTRAVENOUS | Status: AC

## 2024-07-03 MED ORDER — LIDOCAINE HCL 2 % IJ SOLN
20.0000 mL | Freq: Once | INTRAMUSCULAR | Status: AC
  Administered 2024-07-03 (×2): 400 mg

## 2024-07-03 NOTE — Progress Notes (Signed)
 PROVIDER NOTE: Interpretation of information contained herein should be left to medically-trained personnel. Specific patient instructions are provided elsewhere under Patient Instructions section of medical record. This document was created in part using STT-dictation technology, any transcriptional errors that may result from this process are unintentional.  Patient: Clinton Thomas Type: Established DOB: Mar 23, 1946 MRN: [id number] PCP: Jesusa Junita POUR, MD  Service: Procedure DOS: 07/03/2024 Setting: Ambulatory Location: Ambulatory outpatient facility Delivery: Face-to-face Provider: Wallie Sherry, MD Specialty: Interventional Pain Management Specialty designation: 09 Location: Outpatient facility Ref. Prov.: Trivedi, Mehul K, MD       Interventional Therapy   Type: Cervical Epidural Steroid injection (CESI) (Interlaminar) #1  Laterality: Midline  Level: C7-T1 DOS: 07/03/2024  Provider: Wallie Sherry, MD Imaging: Fluoroscopy-guided Spinal ([id number]) Anesthesia: Local anesthesia (1-2% Lidocaine ) Sedation: Minimal Sedation                        Medical Necessity Purpose: Diagnostic/Therapeutic Rationale (medical necessity): procedure needed and proper for the diagnosis and/or treatment of [name] medical symptoms and needs. Indications: Cervicalgia, cervical radicular pain, degenerative disc disease, severe enough to impact quality of life or function. 1. Cervical radicular pain   2. Cervico-occipital neuralgia   3. Chronic pain syndrome    NAS-11 Pain score:   Pre-procedure: 10-Worst pain ever/10   Post-procedure: 4 /10     Position  Prep  Materials:  Location setting: Procedure suite Position: Prone, on modified reverse trendelenburg to facilitate breathing, with head in head-cradle. Pillows positioned under chest (below chin-level) with cervical spine flexed. Safety Precautions: Patient was assessed for positional comfort and pressure points before starting the  procedure. Prepping solution: DuraPrep (Iodine Povacrylex [0.7% available iodine] and Isopropyl Alcohol, 74% w/w) Prep Area: Entire  cervicothoracic region Approach: percutaneous, paramedial Intended target: Posterior cervical epidural space Materials Procedure:  Tray: Epidural Needle(s): Epidural (Tuohy) Qty: 1 Length: (90mm) 3.5-inch Gauge: 22G  H&P (Pre-op Assessment):  Clinton Thomas is a 78 y.o. (year old), male patient, seen today for interventional treatment. He  has a past surgical history that includes Revision total knee arthroplasty; Back surgery; and Cervical disc surgery. [name] has a current medication list which includes the following prescription(s): albuterol , allopurinol , amlodipine , aspirin ec, atorvastatin , benzonatate , buprenorphine , [START ON 07/18/2024] buprenorphine , carvedilol , chlorthalidone , diclofenac sodium, docusate sodium , fluticasone , gabapentin , guaifenesin, ketoconazole , lisinopril, memantine, cepacol regular strength, pantoprazole , sertraline , sodium bicarbonate, and trazodone , and the following Facility-Administered Medications: lactated ringers . His primarily concern today is the Neck Pain  Initial Vital Signs:  Pulse/HCG Rate: 61ECG Heart Rate: 61 Temp: 97.6 F (36.4 C) Resp: 14 BP: 111/62 SpO2: 98 %  BMI: Estimated body mass index is 29.82 kg/m as calculated from the following:   Height as of this encounter: 6' 1 (1.854 m).   Weight as of this encounter: 226 lb (102.5 kg).  Risk Assessment: Allergies: Reviewed. He is allergic to latanoprost and linezolid.  Allergy Precautions: None required Coagulopathies: Reviewed. None identified.  Blood-thinner therapy: None at this time Active Infection(s): Reviewed. None identified. Clinton Thomas is afebrile  Site Confirmation: Clinton Thomas was asked to confirm the procedure and laterality before marking the site Procedure checklist: Completed Consent: Before the procedure and under the influence of no  sedative(s), amnesic(s), or anxiolytics, the patient was informed of the treatment options, risks and possible complications. To fulfill our ethical and legal obligations, as recommended by the American Medical Association's Code of Ethics, I have informed the patient of my clinical impression; the nature and  purpose of the treatment or procedure; the risks, benefits, and possible complications of the intervention; the alternatives, including doing nothing; the risk(s) and benefit(s) of the alternative treatment(s) or procedure(s); and the risk(s) and benefit(s) of doing nothing. The patient was provided information about the general risks and possible complications associated with the procedure. These may include, but are not limited to: failure to achieve desired goals, infection, bleeding, organ or nerve damage, allergic reactions, paralysis, and death. In addition, the patient was informed of those risks and complications associated to Spine-related procedures, such as failure to decrease pain; infection (i.e.: Meningitis, epidural or intraspinal abscess); bleeding (i.e.: epidural hematoma, subarachnoid hemorrhage, or any other type of intraspinal or peri-dural bleeding); organ or nerve damage (i.e.: Any type of peripheral nerve, nerve root, or spinal cord injury) with subsequent damage to sensory, motor, and/or autonomic systems, resulting in permanent pain, numbness, and/or weakness of one or several areas of the body; allergic reactions; (i.e.: anaphylactic reaction); and/or death. Furthermore, the patient was informed of those risks and complications associated with the medications. These include, but are not limited to: allergic reactions (i.e.: anaphylactic or anaphylactoid reaction(s)); adrenal axis suppression; blood sugar elevation that in diabetics may result in ketoacidosis or comma; water retention that in patients with history of congestive heart failure may result in shortness of breath,  pulmonary edema, and decompensation with resultant heart failure; weight gain; swelling or edema; medication-induced neural toxicity; particulate matter embolism and blood vessel occlusion with resultant organ, and/or nervous system infarction; and/or aseptic necrosis of one or more joints. Finally, the patient was informed that Medicine is not an exact science; therefore, there is also the possibility of unforeseen or unpredictable risks and/or possible complications that may result in a catastrophic outcome. The patient indicated having understood very clearly. We have given the patient no guarantees and we have made no promises. Enough time was given to the patient to ask questions, all of which were answered to the patient's satisfaction. Mr. Capistran has indicated that he wanted to continue with the procedure. Attestation: I, the ordering provider, attest that I have discussed with the patient the benefits, risks, side-effects, alternatives, likelihood of achieving goals, and potential problems during recovery for the procedure that I have provided informed consent. Date  Time: 07/03/2024 12:59 PM  Pre-Procedure Preparation:  Monitoring: As per clinic protocol. Respiration, ETCO2, SpO2, BP, heart rate and rhythm monitor placed and checked for adequate function Safety Precautions: Patient was assessed for positional comfort and pressure points before starting the procedure. Time-out: I initiated and conducted the Time-out before starting the procedure, as per protocol. The patient was asked to participate by confirming the accuracy of the Time Out information. Verification of the correct person, site, and procedure were performed and confirmed by me, the nursing staff, and the patient. Time-out conducted as per Joint Commission's Universal Protocol (UP.01.01.01). Time: 1335 Start Time: 1335 hrs.  Description  Narrative of Procedure:          Rationale (medical necessity): procedure needed and  proper for the diagnosis and/or treatment of the patient's medical symptoms and needs. Start Time: 1335 hrs. Safety Precautions: Aspiration looking for blood return was conducted prior to all injections. At no point did we inject any substances, as a needle was being advanced. No attempts were made at seeking any paresthesias. Safe injection practices and needle disposal techniques used. Medications properly checked for expiration dates. SDV (single dose vial) medications used. Description of procedure: Protocol guidelines were followed. The patient was  assisted into a comfortable position. The target area was identified and the area prepped in the usual manner. Skin & deeper tissues infiltrated with local anesthetic. Appropriate amount of time allowed to pass for local anesthetics to take effect. Using fluoroscopic guidance, the epidural needle was introduced through the skin, ipsilateral to the reported pain, and advanced to the target area. Posterior laminar os was contacted and the needle walked caudad, until the lamina was cleared. The ligamentum flavum was engaged and the epidural space identified using "loss-of-resistance technique" with 2-3 ml of PF-NaCl (0.9% NSS), in a 5cc dedicated LOR syringe. (See Imaging guidance below for use of contrast details.) Once proper needle placement was secured, and negative aspiration confirmed, the solution was injected in intermittent fashion, asking for systemic symptoms every 0.5cc. The needles were then removed and the area cleansed, making sure to leave some of the prepping solution back to take advantage of its long term bactericidal properties.  3 cc solution made of 1 cc of preservative-free saline, 1 cc of 0.2% ropivacaine , 1 cc of Decadron  10 mg/cc.   Vitals:   07/03/24 1307 07/03/24 1335 07/03/24 1340 07/03/24 1354  BP: 111/62 123/77 136/81 121/69  Pulse: 61     Resp: 14 18 17    Temp: 97.6 F (36.4 C)     SpO2: 98% 99% 97% 97%  Weight: 226 lb  (102.5 kg)     Height: 6' 1 (1.854 m)        End Time: 1340 hrs.  Imaging Guidance (Spinal):          Type of Imaging Technique: Fluoroscopy Guidance (Spinal) Indication(s): Fluoroscopy guidance for needle placement to enhance accuracy in procedures requiring precise needle localization for targeted delivery of medication in or near specific anatomical locations not easily accessible without such real-time imaging assistance. Exposure Time: Please see nurses notes. Contrast: Before injecting any contrast, we confirmed that the patient did not have an allergy to iodine, shellfish, or radiological contrast. Once satisfactory needle placement was completed at the desired level, radiological contrast was injected. Contrast injected under live fluoroscopy. No contrast complications. See chart for type and volume of contrast used. Fluoroscopic Guidance: I was personally present during the use of fluoroscopy. Tunnel Vision Technique used to obtain the best possible view of the target area. Parallax error corrected before commencing the procedure. Direction-depth-direction technique used to introduce the needle under continuous pulsed fluoroscopy. Once target was reached, antero-posterior, oblique, and lateral fluoroscopic projection used confirm needle placement in all planes. Images permanently stored in EMR. Interpretation: I personally interpreted the imaging intraoperatively. Adequate needle placement confirmed in multiple planes. Appropriate spread of contrast into desired area was observed. No evidence of afferent or efferent intravascular uptake. No intrathecal or subarachnoid spread observed. Permanent images saved into the patient's record.  Post-operative Assessment:  Post-procedure Vital Signs:  Pulse/HCG Rate: 61(!) 58 Temp: 97.6 F (36.4 C) Resp: 17 BP: 121/69 SpO2: 97 %  EBL: None  Complications: No immediate post-treatment complications observed by team, or reported by  patient.  Note: The patient tolerated the entire procedure well. A repeat set of vitals were taken after the procedure and the patient was kept under observation following institutional policy, for this type of procedure. Post-procedural neurological assessment was performed, showing return to baseline, prior to discharge. The patient was provided with post-procedure discharge instructions, including a section on how to identify potential problems. Should any problems arise concerning this procedure, the patient was given instructions to immediately contact us , at any  time, without hesitation. In any case, we plan to contact the patient by telephone for a follow-up status report regarding this interventional procedure.  Comments:  No additional relevant information.  Plan of Care (POC)  Plan was also to perform a bilateral occipital nerve block however the patient was unable to remain stationary and was moving during the procedure. I would be mindful of future cervical injections given his inability to remain still even with IV Versed . Follow-up in 4 to 6 weeks for postprocedural evaluation and optimization of medication management with Seema Patel  Medications ordered for procedure: Meds ordered this encounter  Medications   iohexol  (OMNIPAQUE ) 180 MG/ML injection 10 mL    Must be Myelogram-compatible. If not available, you may substitute with a water-soluble, non-ionic, hypoallergenic, myelogram-compatible radiological contrast medium.   lidocaine  (XYLOCAINE ) 2 % (with pres) injection 400 mg   ropivacaine  (PF) 2 mg/mL (0.2%) (NAROPIN ) injection 1 mL   sodium chloride  flush (NS) 0.9 % injection 1 mL   dexamethasone  (DECADRON ) injection 10 mg   dexamethasone  (DECADRON ) injection 10 mg   lactated ringers  infusion   midazolam  (VERSED ) injection 0.5-2 mg    Make sure Flumazenil is available in the pyxis when using this medication. If oversedation occurs, administer 0.2 mg IV over 15 sec. If after  45 sec no response, administer 0.2 mg again over 1 min; may repeat at 1 min intervals; not to exceed 4 doses (1 mg)   Medications administered: We administered iohexol , lidocaine , ropivacaine  (PF) 2 mg/mL (0.2%), sodium chloride  flush, dexamethasone , dexamethasone , lactated ringers , and midazolam .  See the medical record for exact dosing, route, and time of administration.    Follow-up plan:   Return in about 5 weeks (around 08/07/2024) for PPE F2F Seema.     Recent Visits Date Type Provider Dept  06/20/24 Office Visit Marcelino Nurse, MD [facility]  05/22/24 Procedure visit Marcelino Nurse, MD [facility]  05/07/24 Office Visit Marcelino Nurse, MD [facility]  Showing recent visits within past 90 days and meeting all other requirements Today's Visits Date Type Provider Dept  07/03/24 Procedure visit Marcelino Nurse, MD [facility]  Showing today's visits and meeting all other requirements Future Appointments Date Type Provider Dept  08/07/24 Appointment Patel, Seema K, NP [facility]  Showing future appointments within next 90 days and meeting all other requirements   Disposition: Discharge home  Discharge (Date  Time): 07/03/2024; 1404 hrs.   Primary Care Physician: Trivedi, Mehul K, MD Location: [facility] Note by: Nurse Marcelino, MD (TTS technology used. I apologize for any typographical errors that were not detected and corrected.) Date: 07/03/2024; Time: 2:20 PM  Disclaimer:  Medicine is not an Visual merchandiser. The only guarantee in medicine is that nothing is guaranteed. It is important to note that the decision to proceed with this intervention was based on the information collected from the patient. The Data and conclusions were drawn from the patient's questionnaire, the interview, and the physical examination. Because the information was provided in large part by the patient, it cannot be guaranteed that  it has not been purposely or unconsciously manipulated. Every effort has been made to obtain as much relevant data as possible for this evaluation. It is important to note that the conclusions that lead to this procedure are derived in large part from the available data. Always take into account that the treatment will also be dependent on availability of resources and existing treatment guidelines, considered  by other Pain Management Practitioners as being common knowledge and practice, at the time of the intervention. For Medico-Legal purposes, it is also important to point out that variation in procedural techniques and pharmacological choices are the acceptable norm. The indications, contraindications, technique, and results of the above procedure should only be interpreted and judged by a Board-Certified Interventional Pain Specialist with extensive familiarity and expertise in the same exact procedure and technique.

## 2024-07-03 NOTE — Progress Notes (Signed)
 1335 versed  2mg  ivp per Dr. Marcelino order 1340 unable to do GONB due to patient movement.

## 2024-07-03 NOTE — Progress Notes (Signed)
 PROVIDER NOTE: Interpretation of information contained herein should be left to medically-trained personnel. Specific patient instructions are provided elsewhere under Patient Instructions section of medical record. This document was created in part using STT-dictation technology, any transcriptional errors that may result from this process are unintentional.  Patient: Clinton Thomas Type: Established DOB: Mar 23, 1946 MRN: [id number] PCP: Jesusa Junita POUR, MD  Service: Procedure DOS: 07/03/2024 Setting: Ambulatory Location: Ambulatory outpatient facility Delivery: Face-to-face Provider: Wallie Sherry, MD Specialty: Interventional Pain Management Specialty designation: 09 Location: Outpatient facility Ref. Prov.: Trivedi, Mehul K, MD       Interventional Therapy   Type: Cervical Epidural Steroid injection (CESI) (Interlaminar) #1  Laterality: Midline  Level: C7-T1 DOS: 07/03/2024  Provider: Wallie Sherry, MD Imaging: Fluoroscopy-guided Spinal ([id number]) Anesthesia: Local anesthesia (1-2% Lidocaine ) Sedation: Minimal Sedation                        Medical Necessity Purpose: Diagnostic/Therapeutic Rationale (medical necessity): procedure needed and proper for the diagnosis and/or treatment of Clinton Thomas medical symptoms and needs. Indications: Cervicalgia, cervical radicular pain, degenerative disc disease, severe enough to impact quality of life or function. 1. Cervical radicular pain   2. Cervico-occipital neuralgia   3. Chronic pain syndrome    NAS-11 Pain score:   Pre-procedure: 10-Worst pain ever/10   Post-procedure: 4 /10     Position  Prep  Materials:  Location setting: Procedure suite Position: Prone, on modified reverse trendelenburg to facilitate breathing, with head in head-cradle. Pillows positioned under chest (below chin-level) with cervical spine flexed. Safety Precautions: Patient was assessed for positional comfort and pressure points before starting the  procedure. Prepping solution: DuraPrep (Iodine Povacrylex [0.7% available iodine] and Isopropyl Alcohol, 74% w/w) Prep Area: Entire  cervicothoracic region Approach: percutaneous, paramedial Intended target: Posterior cervical epidural space Materials Procedure:  Tray: Epidural Needle(s): Epidural (Tuohy) Qty: 1 Length: (90mm) 3.5-inch Gauge: 22G  H&P (Pre-op Assessment):  Clinton Thomas is a 78 y.o. (year old), male patient, seen today for interventional treatment. He  has a past surgical history that includes Revision total knee arthroplasty; Back surgery; and Cervical disc surgery. [name] has a current medication list which includes the following prescription(s): albuterol , allopurinol , amlodipine , aspirin ec, atorvastatin , benzonatate , buprenorphine , [START ON 07/18/2024] buprenorphine , carvedilol , chlorthalidone , diclofenac sodium, docusate sodium , fluticasone , gabapentin , guaifenesin, ketoconazole , lisinopril, memantine, cepacol regular strength, pantoprazole , sertraline , sodium bicarbonate, and trazodone , and the following Facility-Administered Medications: lactated ringers . His primarily concern today is the Neck Pain  Initial Vital Signs:  Pulse/HCG Rate: 61ECG Heart Rate: 61 Temp: 97.6 F (36.4 C) Resp: 14 BP: 111/62 SpO2: 98 %  BMI: Estimated body mass index is 29.82 kg/m as calculated from the following:   Height as of this encounter: 6' 1 (1.854 m).   Weight as of this encounter: 226 lb (102.5 kg).  Risk Assessment: Allergies: Reviewed. He is allergic to latanoprost and linezolid.  Allergy Precautions: None required Coagulopathies: Reviewed. None identified.  Blood-thinner therapy: None at this time Active Infection(s): Reviewed. None identified. Clinton Thomas is 78 afebrile  Site Confirmation: Clinton Thomas was 78 asked to confirm the procedure and laterality before marking the site Procedure checklist: Completed Consent: Before the procedure and under the influence of no  sedative(s), amnesic(s), or anxiolytics, the patient was informed of the treatment options, risks and possible complications. To fulfill our ethical and legal obligations, as recommended by the American Medical Association's Code of Ethics, I have informed the patient of my clinical impression; the nature and  purpose of the treatment or procedure; the risks, benefits, and possible complications of the intervention; the alternatives, including doing nothing; the risk(s) and benefit(s) of the alternative treatment(s) or procedure(s); and the risk(s) and benefit(s) of doing nothing. The patient was provided information about the general risks and possible complications associated with the procedure. These may include, but are not limited to: failure to achieve desired goals, infection, bleeding, organ or nerve damage, allergic reactions, paralysis, and death. In addition, the patient was informed of those risks and complications associated to Spine-related procedures, such as failure to decrease pain; infection (i.e.: Meningitis, epidural or intraspinal abscess); bleeding (i.e.: epidural hematoma, subarachnoid hemorrhage, or any other type of intraspinal or peri-dural bleeding); organ or nerve damage (i.e.: Any type of peripheral nerve, nerve root, or spinal cord injury) with subsequent damage to sensory, motor, and/or autonomic systems, resulting in permanent pain, numbness, and/or weakness of one or several areas of the body; allergic reactions; (i.e.: anaphylactic reaction); and/or death. Furthermore, the patient was informed of those risks and complications associated with the medications. These include, but are not limited to: allergic reactions (i.e.: anaphylactic or anaphylactoid reaction(s)); adrenal axis suppression; blood sugar elevation that in diabetics may result in ketoacidosis or comma; water retention that in patients with history of congestive heart failure may result in shortness of breath,  pulmonary edema, and decompensation with resultant heart failure; weight gain; swelling or edema; medication-induced neural toxicity; particulate matter embolism and blood vessel occlusion with resultant organ, and/or nervous system infarction; and/or aseptic necrosis of one or more joints. Finally, the patient was informed that Medicine is not an exact science; therefore, there is also the possibility of unforeseen or unpredictable risks and/or possible complications that may result in a catastrophic outcome. The patient indicated having understood very clearly. We have given the patient no guarantees and we have made no promises. Enough time was given to the patient to ask questions, all of which were answered to the patient's satisfaction. Mr. Capistran has indicated that he wanted to continue with the procedure. Attestation: I, the ordering provider, attest that I have discussed with the patient the benefits, risks, side-effects, alternatives, likelihood of achieving goals, and potential problems during recovery for the procedure that I have provided informed consent. Date  Time: 07/03/2024 12:59 PM  Pre-Procedure Preparation:  Monitoring: As per clinic protocol. Respiration, ETCO2, SpO2, BP, heart rate and rhythm monitor placed and checked for adequate function Safety Precautions: Patient was assessed for positional comfort and pressure points before starting the procedure. Time-out: I initiated and conducted the Time-out before starting the procedure, as per protocol. The patient was asked to participate by confirming the accuracy of the Time Out information. Verification of the correct person, site, and procedure were performed and confirmed by me, the nursing staff, and the patient. Time-out conducted as per Joint Commission's Universal Protocol (UP.01.01.01). Time: 1335 Start Time: 1335 hrs.  Description  Narrative of Procedure:          Rationale (medical necessity): procedure needed and  proper for the diagnosis and/or treatment of the patient's medical symptoms and needs. Start Time: 1335 hrs. Safety Precautions: Aspiration looking for blood return was conducted prior to all injections. At no point did we inject any substances, as a needle was being advanced. No attempts were made at seeking any paresthesias. Safe injection practices and needle disposal techniques used. Medications properly checked for expiration dates. SDV (single dose vial) medications used. Description of procedure: Protocol guidelines were followed. The patient was  assisted into a comfortable position. The target area was identified and the area prepped in the usual manner. Skin & deeper tissues infiltrated with local anesthetic. Appropriate amount of time allowed to pass for local anesthetics to take effect. Using fluoroscopic guidance, the epidural needle was introduced through the skin, ipsilateral to the reported pain, and advanced to the target area. Posterior laminar os was contacted and the needle walked caudad, until the lamina was cleared. The ligamentum flavum was engaged and the epidural space identified using "loss-of-resistance technique" with 2-3 ml of PF-NaCl (0.9% NSS), in a 5cc dedicated LOR syringe. (See Imaging guidance below for use of contrast details.) Once proper needle placement was secured, and negative aspiration confirmed, the solution was injected in intermittent fashion, asking for systemic symptoms every 0.5cc. The needles were then removed and the area cleansed, making sure to leave some of the prepping solution back to take advantage of its long term bactericidal properties.  3 cc solution made of 1 cc of preservative-free saline, 1 cc of 0.2% ropivacaine , 1 cc of Decadron  10 mg/cc.   Vitals:   07/03/24 1307 07/03/24 1335 07/03/24 1340 07/03/24 1354  BP: 111/62 123/77 136/81 121/69  Pulse: 61     Resp: 14 18 17    Temp: 97.6 F (36.4 C)     SpO2: 98% 99% 97% 97%  Weight: 226 lb  (102.5 kg)     Height: 6' 1 (1.854 m)        End Time: 1340 hrs.  Imaging Guidance (Spinal):          Type of Imaging Technique: Fluoroscopy Guidance (Spinal) Indication(s): Fluoroscopy guidance for needle placement to enhance accuracy in procedures requiring precise needle localization for targeted delivery of medication in or near specific anatomical locations not easily accessible without such real-time imaging assistance. Exposure Time: Please see nurses notes. Contrast: Before injecting any contrast, we confirmed that the patient did not have an allergy to iodine, shellfish, or radiological contrast. Once satisfactory needle placement was completed at the desired level, radiological contrast was injected. Contrast injected under live fluoroscopy. No contrast complications. See chart for type and volume of contrast used. Fluoroscopic Guidance: I was personally present during the use of fluoroscopy. Tunnel Vision Technique used to obtain the best possible view of the target area. Parallax error corrected before commencing the procedure. Direction-depth-direction technique used to introduce the needle under continuous pulsed fluoroscopy. Once target was reached, antero-posterior, oblique, and lateral fluoroscopic projection used confirm needle placement in all planes. Images permanently stored in EMR. Interpretation: I personally interpreted the imaging intraoperatively. Adequate needle placement confirmed in multiple planes. Appropriate spread of contrast into desired area was observed. No evidence of afferent or efferent intravascular uptake. No intrathecal or subarachnoid spread observed. Permanent images saved into the patient's record.  Post-operative Assessment:  Post-procedure Vital Signs:  Pulse/HCG Rate: 61(!) 58 Temp: 97.6 F (36.4 C) Resp: 17 BP: 121/69 SpO2: 97 %  EBL: None  Complications: No immediate post-treatment complications observed by team, or reported by  patient.  Note: The patient tolerated the entire procedure well. A repeat set of vitals were taken after the procedure and the patient was kept under observation following institutional policy, for this type of procedure. Post-procedural neurological assessment was performed, showing return to baseline, prior to discharge. The patient was provided with post-procedure discharge instructions, including a section on how to identify potential problems. Should any problems arise concerning this procedure, the patient was given instructions to immediately contact us , at any  time, without hesitation. In any case, we plan to contact the patient by telephone for a follow-up status report regarding this interventional procedure.  Comments:  No additional relevant information.  Plan of Care (POC)  Plan was also to perform a bilateral occipital nerve block however the patient was unable to remain stationary and was moving during the procedure. I would be mindful of future cervical injections given his inability to remain still even with IV Versed . Follow-up in 4 to 6 weeks for postprocedural evaluation and optimization of medication management with Seema Patel  Medications ordered for procedure: Meds ordered this encounter  Medications   iohexol  (OMNIPAQUE ) 180 MG/ML injection 10 mL    Must be Myelogram-compatible. If not available, you may substitute with a water-soluble, non-ionic, hypoallergenic, myelogram-compatible radiological contrast medium.   lidocaine  (XYLOCAINE ) 2 % (with pres) injection 400 mg   ropivacaine  (PF) 2 mg/mL (0.2%) (NAROPIN ) injection 1 mL   sodium chloride  flush (NS) 0.9 % injection 1 mL   dexamethasone  (DECADRON ) injection 10 mg   dexamethasone  (DECADRON ) injection 10 mg   lactated ringers  infusion   midazolam  (VERSED ) injection 0.5-2 mg    Make sure Flumazenil is available in the pyxis when using this medication. If oversedation occurs, administer 0.2 mg IV over 15 sec. If after  45 sec no response, administer 0.2 mg again over 1 min; may repeat at 1 min intervals; not to exceed 4 doses (1 mg)   Medications administered: We administered iohexol , lidocaine , ropivacaine  (PF) 2 mg/mL (0.2%), sodium chloride  flush, dexamethasone , dexamethasone , lactated ringers , and midazolam .  See the medical record for exact dosing, route, and time of administration.    Follow-up plan:   Return in about 5 weeks (around 08/07/2024) for PPE F2F Seema.     Recent Visits Date Type Provider Dept  06/20/24 Office Visit Marcelino Nurse, MD [facility]  05/22/24 Procedure visit Marcelino Nurse, MD [facility]  05/07/24 Office Visit Marcelino Nurse, MD [facility]  Showing recent visits within past 90 days and meeting all other requirements Today's Visits Date Type Provider Dept  07/03/24 Procedure visit Marcelino Nurse, MD [facility]  Showing today's visits and meeting all other requirements Future Appointments Date Type Provider Dept  08/07/24 Appointment Patel, Seema K, NP [facility]  Showing future appointments within next 90 days and meeting all other requirements   Disposition: Discharge home  Discharge (Date  Time): 07/03/2024; 1404 hrs.   Primary Care Physician: Trivedi, Mehul K, MD Location: [facility] Note by: Nurse Marcelino, MD (TTS technology used. I apologize for any typographical errors that were not detected and corrected.) Date: 07/03/2024; Time: 2:20 PM  Disclaimer:  Medicine is not an Visual merchandiser. The only guarantee in medicine is that nothing is guaranteed. It is important to note that the decision to proceed with this intervention was based on the information collected from the patient. The Data and conclusions were drawn from the patient's questionnaire, the interview, and the physical examination. Because the information was provided in large part by the patient, it cannot be guaranteed that  it has not been purposely or unconsciously manipulated. Every effort has been made to obtain as much relevant data as possible for this evaluation. It is important to note that the conclusions that lead to this procedure are derived in large part from the available data. Always take into account that the treatment will also be dependent on availability of resources and existing treatment guidelines, considered  by other Pain Management Practitioners as being common knowledge and practice, at the time of the intervention. For Medico-Legal purposes, it is also important to point out that variation in procedural techniques and pharmacological choices are the acceptable norm. The indications, contraindications, technique, and results of the above procedure should only be interpreted and judged by a Board-Certified Interventional Pain Specialist with extensive familiarity and expertise in the same exact procedure and technique.

## 2024-07-04 ENCOUNTER — Telehealth: Payer: Self-pay | Admitting: *Deleted

## 2024-07-04 NOTE — Telephone Encounter (Signed)
 Post procedure call; reports that everything is going well and patient is experiencing good pain relief.

## 2024-07-16 ENCOUNTER — Encounter: Payer: Self-pay | Admitting: Podiatry

## 2024-07-16 ENCOUNTER — Ambulatory Visit: Admitting: Podiatry

## 2024-07-16 DIAGNOSIS — M79674 Pain in right toe(s): Secondary | ICD-10-CM | POA: Diagnosis not present

## 2024-07-16 DIAGNOSIS — M79675 Pain in left toe(s): Secondary | ICD-10-CM | POA: Diagnosis not present

## 2024-07-16 DIAGNOSIS — B351 Tinea unguium: Secondary | ICD-10-CM

## 2024-07-16 DIAGNOSIS — B353 Tinea pedis: Secondary | ICD-10-CM | POA: Diagnosis not present

## 2024-07-16 MED ORDER — KETOCONAZOLE 2 % EX CREA: 1.0000 | TOPICAL_CREAM | Freq: Every day | CUTANEOUS | 2 refills | Status: AC

## 2024-07-16 NOTE — Patient Instructions (Addendum)
 To prevent reinfection, spray shoes with lysol every evening.  Clean tub or shower with bleach based cleanser.  Spray Lotrimin AF spray powder between toes once daily.  Athlete's Foot Athlete's foot (tinea pedis) is a fungal infection of the skin on your feet. It often occurs on the skin that is between or underneath the toes. It can also occur on the soles of your feet. The infection can spread from person to person (is contagious). It can also spread when a person's bare feet come in contact with the fungus on shower floors or on items such as shoes. What are the causes? This condition is caused by a fungus that grows in warm, moist places. You can get athlete's foot by sharing shoes, shower stalls, towels, and wet floors with someone who is infected. Not washing your feet or changing your socks often enough can also lead to athlete's foot. What increases the risk? This condition is more likely to develop in: Men. People who have a weak body defense system (immune system). People who have diabetes. People who use public showers, such as at a gym. People who wear heavy-duty shoes, such as Youth worker. Seasons with warm, humid weather. What are the signs or symptoms? Symptoms of this condition include: Itchy areas between your toes or on the soles of your feet. White, flaky, or scaly areas between your toes or on the soles of your feet. Very itchy small blisters between your toes or on the soles of your feet. Small cuts in your skin. These cuts can become infected. Thick or discolored toenails. How is this diagnosed? This condition may be diagnosed with a physical exam and a review of your medical history. Your health care provider may also take a skin or toenail sample to examine under a microscope. How is this treated? This condition is treated with antifungal medicines. These may be applied as powders, ointments, or creams. In severe cases, an oral antifungal  medicine may be given. Follow these instructions at home: Medicines Apply or take over-the-counter and prescription medicines only as told by your health care provider. Apply your antifungal medicine as told by your health care provider. Do not stop using the antifungal even if your condition improves. Foot care Do not scratch your feet. Keep your feet dry: Wear cotton or wool socks. Change your socks every day or if they become wet. Wear shoes that allow air to flow, such as sandals or canvas tennis shoes. Wash and dry your feet, including the area between your toes. Also, wash and dry your feet: Every day or as told by your health care provider. After exercising. General instructions Do not let others use towels, shoes, nail clippers, or other personal items that touch your feet. Protect your feet by wearing sandals in wet areas, such as locker rooms and shared showers. Keep all follow-up visits. This is important. If you have diabetes, keep your blood sugar under control. Contact a health care provider if: You have a fever. You have swelling, soreness, warmth, or redness in your foot. Your feet are not getting better with treatment. Your symptoms get worse. You have new symptoms. You have severe pain. Summary Athlete's foot (tinea pedis) is a fungal infection of the skin on your feet. It often occurs on skin that is between or underneath the toes. This condition is caused by a fungus that grows in warm, moist places. Symptoms include white, flaky, or scaly areas between your toes or on the soles  of your feet. This condition is treated with antifungal medicines. Keep your feet clean. Always dry them thoroughly. This information is not intended to replace advice given to you by your health care provider. Make sure you discuss any questions you have with your health care provider. Document Revised: 02/28/2021 Document Reviewed: 02/28/2021 Elsevier Patient Education  2024 [employer].

## 2024-07-19 ENCOUNTER — Encounter: Payer: Self-pay | Admitting: Podiatry

## 2024-07-19 NOTE — Progress Notes (Addendum)
  Subjective:  Patient ID: Clinton Thomas, male    DOB: [birth year],  MRN: [id number]  DEVAL MROCZKA presents to clinic today for at risk foot care with history of peripheral neuropathy and painful thick toenails that are difficult to trim. Pain interferes with ambulation. Aggravating factors include wearing enclosed shoe gear. Pain is relieved with periodic professional debridement.  Chief Complaint  Patient presents with   Rfc    Rm17 Routine foot care/ Dr. Junita Bound last visit April 30, 2024   New problem(s): None.   PCP is Trivedi, Mehul K, MD.  Review of Systems: Negative except as noted in the HPI.  Objective:  There were no vitals filed for this visit. Clinton Thomas is a pleasant 78 y.o. male in NAD. AAO x 3.  Vascular Examination: Capillary refill time immediate b/l. Vascular status intact b/l with palpable pedal pulses. Pedal hair present b/l. No pain with calf compression b/l. Skin temperature gradient WNL b/l. No cyanosis or clubbing b/l. No ischemia or gangrene noted b/l.   Neurological Examination: Pt has subjective symptoms of neuropathy. Sensation grossly intact b/l with 10 gram monofilament. Vibratory sensation intact b/l.   Dermatological Examination: Interdigital maceration noted bilateral 1st-4th webspace(s). No blistering, no weeping, no open wounds. Diffuse scaling noted peripherally and plantarly b/l feet.  No interdigital macerations.  No blisters, no weeping. No signs of secondary bacterial infection noted.  Pedal skin with normal turgor, texture and tone b/l.  No open wounds.  Toenails 1-5 b/l thick, discolored, elongated with subungual debris and pain on dorsal palpation.   No corns, calluses nor porokeratotic lesions noted.  Musculoskeletal Examination: Muscle strength 5/5 to all lower extremity muscle groups bilaterally. No pain, crepitus or joint limitation noted with ROM bilateral LE. No gross bony deformities bilaterally.  Radiographs:  None  Assessment/Plan: 1. Pain due to onychomycosis of toenails of both feet   2. Tinea pedis of both feet     Meds ordered this encounter  Medications   ketoconazole  (NIZORAL ) 2 % cream    Sig: Apply 1 Application topically daily.    Dispense:  60 g    Refill:  2  -Consent given for treatment. Patient examined. All patient's and/or POA's questions/concerns addressed on today's visit. Mycotic toenails 1-5 debrided in length and girth without incident. Treatment was provided by assistant Mable FABIENE Cherry under my supervision. Continue soft, supportive shoe gear daily. Report any pedal injuries to medical professional. -For tinea pedis, Rx sent to pharmacy for Ketoconazole  Cream 2% to be applied once daily for six weeks. -Patient/POA instructed to purchase OTC Lotrimin antifungal spray powder between toes once daily. -Patient/POA to call should there be question/concern in the interim.   Return in about 3 months (around 10/16/2024).  Delon LITTIE Merlin, DPM      [place] LOCATION: [address], [place] [address]                   Office [phone]   [facility] LOCATION: [address] [place], [place] [address] Office [phone]

## 2024-08-07 ENCOUNTER — Encounter: Payer: Self-pay | Admitting: Nurse Practitioner

## 2024-08-07 ENCOUNTER — Ambulatory Visit: Attending: Nurse Practitioner | Admitting: Nurse Practitioner

## 2024-08-07 VITALS — BP 123/65 | HR 65 | Temp 98.1°F | Resp 20 | Ht 73.0 in | Wt 234.0 lb

## 2024-08-07 DIAGNOSIS — Z79899 Other long term (current) drug therapy: Secondary | ICD-10-CM | POA: Insufficient documentation

## 2024-08-07 DIAGNOSIS — G894 Chronic pain syndrome: Secondary | ICD-10-CM | POA: Insufficient documentation

## 2024-08-07 DIAGNOSIS — M47812 Spondylosis without myelopathy or radiculopathy, cervical region: Secondary | ICD-10-CM | POA: Diagnosis present

## 2024-08-07 DIAGNOSIS — M5412 Radiculopathy, cervical region: Secondary | ICD-10-CM | POA: Diagnosis present

## 2024-08-07 DIAGNOSIS — M5481 Occipital neuralgia: Secondary | ICD-10-CM | POA: Insufficient documentation

## 2024-08-07 MED ORDER — BUPRENORPHINE 7.5 MCG/HR TD PTWK: 1.0000 | MEDICATED_PATCH | TRANSDERMAL | 2 refills | Status: DC

## 2024-08-07 NOTE — Progress Notes (Signed)
 Safety precautions to be maintained throughout the outpatient stay will include: orient to surroundings, keep bed in low position, maintain call bell within reach at all times, provide assistance with transfer out of bed and ambulation.

## 2024-08-07 NOTE — Progress Notes (Signed)
 PROVIDER NOTE: Interpretation of information contained herein should be left to medically-trained personnel. Specific patient instructions are provided elsewhere under Patient Instructions section of medical record. This document was created in part using AI and STT-dictation technology, any transcriptional errors that may result from this process are unintentional.  Patient: Clinton Thomas  Service: E/M   PCP: Jesusa Junita POUR, MD  DOB: [birth year]  DOS: 08/07/2024  Provider: Emmy POUR Blanch, NP  MRN: [id number]  Delivery: Face-to-face  Specialty: Interventional Pain Management  Type: Established Patient  Setting: Ambulatory outpatient facility  Specialty designation: 09  Referring Prov.: Trivedi, Mehul K, MD  Location: Outpatient office facility       History of present illness (HPI) [name], a 78 y.o. year old male, is here today because of his Chronic pain syndrome [G89.4]. Clinton Thomas primary complain today is Neck Pain  Pertinent problems: [name] has GOUT; Essential hypertension; Atrial flutter (HCC); Hx of colonic polyps; Hx of hepatitis C; Cervico-occipital neuralgia; Anemia; Hx of thrombocytopenia; Paroxysmal atrial flutter (HCC); On continuous oral anticoagulation, beginning today for possible ablation; Syncope; Cervical facet joint syndrome; and Chronic pain syndrome on their pertinent problem list.  Pain Assessment: Severity of Chronic pain is reported as a 0-No pain/10. Location: Neck Mid/Back of head. Onset: More than a month ago. Quality: Aching, Sharp, Nagging. Timing: Intermittent. Modifying factor(s): Procedure. Vitals:  height is 6' 1 (1.854 m) and weight is 234 lb (106.1 kg). His temporal temperature is 98.1 F (36.7 C). His blood pressure is 123/65 and his pulse is 65. His respiration is 20.  BMI: Estimated body mass index is 30.87 kg/m as calculated from the following:   Height as of this encounter: 6' 1 (1.854 m).   Weight as of this encounter: 234 lb (106.1  kg).  Last encounter: 06/20/2024 Last procedure: 07/03/2024  Reason for encounter: post-procedure evaluation and assessment.   Ms. [name] received a diagnostic/therapeutic Cervical Epidural Steroid injection (CESI) on July 03, 2024.  He reports 100% pain relief and functional improvement during local anesthetic phase followed by sustained 75% pain relief and functional improvement since the procedure with pain level reduce from 10/10 to 5/10.   Procedure Type: Cervical Epidural Steroid injection (CESI) (Interlaminar) #1  Laterality: Midline  Level: C7-T1 DOS: 07/03/2024  Provider: Wallie Sherry, MD Imaging: Fluoroscopy-guided Spinal ([id number]) Anesthesia: Local anesthesia (1-2% Lidocaine ) Sedation: Minimal Sedation                         Medical Necessity Purpose: Diagnostic/Therapeutic Rationale (medical necessity): procedure needed and proper for the diagnosis and/or treatment of [name] medical symptoms and needs. Indications: Cervicalgia, cervical radicular pain, degenerative disc disease, severe enough to impact quality of life or function. 1. Cervical radicular pain   2. Cervico-occipital neuralgia   3. Chronic pain syndrome     NAS-11 Pain score:        Pre-procedure: 10-Worst pain ever/10        Post-procedure: 4 /10  Post-Procedure Evaluation  Effectiveness:  Initial hour after procedure: 100 % . Subsequent 4-6 hours post-procedure: 50 % . Analgesia past initial 6 hours: 75 % (Patient has had good relief, not waking up crying. Still having some pain, no longer at a 10/10 pain. Pain level is more on a scale of 5/10.) . Ongoing improvement:  Analgesic:  Clinton Thomas received a diagnostic/therapeutic Cervical Epidural Steroid injection (CESI) on July 03, 2024.  He reports 100% pain relief and functional  improvement during local anesthetic phase followed by sustained 75% pain relief and functional improvement since the procedure. Function: Clinton Thomas reports  improvement in function ROM: Clinton Thomas reports improvement in ROM  Pharmacotherapy Assessment   Butrans  7.5 mcg/h, 1 patch placed onto the skin once a week. Monitoring: [place] PMP: PDMP not reviewed this encounter.       Pharmacotherapy: No side-effects or adverse reactions reported. Compliance: No problems identified. Effectiveness: Clinically acceptable.  Erlene Doyal Thomas, [place]  08/07/2024  1:14 PM  Sign when Signing Visit Safety precautions to be maintained throughout the outpatient stay will include: orient to surroundings, keep bed in low position, maintain call bell within reach at all times, provide assistance with transfer out of bed and ambulation.     UDS:  No results found for: SUMMARY  No results found for: CBDTHCR No results found for: D8THCCBX No results found for: D9THCCBX  ROS  Constitutional: Denies any fever or chills Gastrointestinal: No reported hemesis, hematochezia, vomiting, or acute GI distress Musculoskeletal: Denies any acute onset joint swelling, redness, loss of ROM, or weakness Neurological: No reported episodes of acute onset apraxia, aphasia, dysarthria, agnosia, amnesia, paralysis, loss of coordination, or loss of consciousness  Medication Review  albuterol , allopurinol , amLODipine , aspirin EC, atorvastatin , benzonatate , buprenorphine , carvedilol , chlorthalidone , diclofenac Sodium, docusate sodium , fluticasone , gabapentin , guaifenesin, ketoconazole , lisinopril, memantine, menthol, pantoprazole , sertraline , sodium bicarbonate, and traZODone   History Review  Allergy: Mr. Becvar is allergic to latanoprost and linezolid. Drug: Mr. Scobie  reports no history of drug use. Alcohol:  reports that he does not currently use alcohol. Tobacco:  reports that he has quit smoking. He has never used smokeless tobacco. Social: Mr. Befort  reports that he has quit smoking. He has never used smokeless tobacco. He reports that he does not currently use alcohol. He  reports that he does not use drugs. Medical:  has a past medical history of Atrial flutter (HCC), Colon polyp, Gout, Hepatitis C, echocardiogram, Hypertension, Microcytic anemia, Peripheral neuropathy, and Thrombocytopenia (HCC). Surgical: Mr. Chovanec  has a past surgical history that includes Revision total knee arthroplasty; Back surgery; and Cervical disc surgery. Family: family history includes Arthritis in his sister; COPD in his father; Stroke in his mother.  Laboratory Chemistry Profile   Renal Lab Results  Component Value Date   BUN 31 (H) 02/28/2019   CREATININE 1.78 (H) 02/28/2019   GFRAA 43 (L) 02/28/2019   GFRNONAA 37 (L) 02/28/2019    Hepatic Lab Results  Component Value Date   AST 26 02/27/2019   ALT 22 02/27/2019   ALBUMIN 3.9 02/27/2019   ALKPHOS 64 02/27/2019   LIPASE 66 (H) 02/27/2018    Electrolytes Lab Results  Component Value Date   NA 140 02/28/2019   K 4.3 02/28/2019   CL 110 02/28/2019   CALCIUM  9.4 02/28/2019   MG 2.1 02/27/2019    Bone No results found for: VD25OH, VD125OH2TOT, CI6874NY7, CI7874NY7, 25OHVITD1, 25OHVITD2, 25OHVITD3, TESTOFREE, TESTOSTERONE  Inflammation (CRP: Acute Phase) (ESR: Chronic Phase) Lab Results  Component Value Date   ESRSEDRATE 52 (H) 10/04/2010         Note: Above Lab results reviewed.  Recent Imaging Review  DG PAIN CLINIC C-ARM 1-60 MIN NO REPORT Fluoro was used, but no Radiologist interpretation will be provided.  Please refer to NOTES tab for provider progress note. Note: Reviewed        Physical Exam  Vitals: BP 123/65 (BP Location: Left Arm, Patient Position: Sitting)   Pulse 65  Temp 98.1 F (36.7 C) (Temporal)   Resp 20   Ht 6' 1 (1.854 m)   Wt 234 lb (106.1 kg)   BMI 30.87 kg/m  BMI: Estimated body mass index is 30.87 kg/m as calculated from the following:   Height as of this encounter: 6' 1 (1.854 m).   Weight as of this encounter: 234 lb (106.1 kg). Ideal: Ideal body  weight: 79.9 kg (176 lb 2.4 oz) Adjusted ideal body weight: 90.4 kg (199 lb 4.6 oz) General appearance: Well nourished, well developed, and well hydrated. In no apparent acute distress Mental status: Alert, oriented x 3 (person, place, & time)       Respiratory: No evidence of acute respiratory distress Eyes: PERLA   Assessment   Diagnosis Status  1. Chronic pain syndrome   2. Cervical radicular pain   3. Cervico-occipital neuralgia   4. Medication management   5. Cervical spondylosis    Controlled Controlled Controlled   Updated Problems: No problems updated.  Plan of Care  Problem-specific:  Assessment and Plan  We will continue on current medication regimen.  Prescribing drug monitoring (PDMP) reviewed; findings consistent with the use of prescribed medication and no evidence of narcotic misuse or abuse.  No other new issues or problems reported at this visit.  Schedule follow-up in 90 days for medication management with Emmy Blanch, NP.   Mr. EDREI NORGAARD has a current medication list which includes the following long-term medication(s): albuterol , allopurinol , atorvastatin , carvedilol , chlorthalidone , fluticasone , gabapentin , memantine, pantoprazole , and sertraline .  Pharmacotherapy (Medications Ordered): Meds ordered this encounter  Medications   buprenorphine  (BUTRANS ) 7.5 MCG/HR    Sig: Place 1 patch onto the skin once a week.    Dispense:  4 patch    Refill:  2    Chronic Pain: STOP Act (Not applicable) Fill 1 day early if closed on refill date. Avoid benzodiazepines within 8 hours of opioids   Orders:  No orders of the defined types were placed in this encounter.     Return in about 3 months (around 11/06/2024) for (F2F), (MM), Emmy Blanch NP.    Recent Visits Date Type Provider Dept  07/03/24 Procedure visit Marcelino Nurse, MD [facility]  06/20/24 Office Visit Marcelino Nurse, MD [facility]  05/22/24 Procedure visit Marcelino Nurse,  MD [facility]  Showing recent visits within past 90 days and meeting all other requirements Today's Visits Date Type Provider Dept  08/07/24 Office Visit Khylei Wilms K, NP [facility]  Showing today's visits and meeting all other requirements Future Appointments Date Type Provider Dept  10/29/24 Appointment Aylani Spurlock K, NP [facility]  Showing future appointments within next 90 days and meeting all other requirements  I discussed the assessment and treatment plan with the patient. The patient was provided an opportunity to ask questions and all were answered. The patient agreed with the plan and demonstrated an understanding of the instructions.  Patient advised to call back or seek an in-person evaluation if the symptoms or condition worsens.  I personally spent a total of 30 minutes in the care of the patient today including preparing to see the patient, getting/reviewing separately obtained history, performing a medically appropriate exam/evaluation, counseling and educating, placing orders, documenting clinical information in the EHR, independently interpreting results, and communicating results.   Duration of encounter:  minutes.  Total time on encounter, as per AMA guidelines included both the face-to-face and non-face-to-face time personally spent by the physician  and/or other qualified health care professional(s) on the day of the encounter (includes time in activities that require the physician or other qualified health care professional and does not include time in activities normally performed by clinical staff). Physician's time may include the following activities when performed: Preparing to see the patient (e.g., pre-charting review of records, searching for previously ordered imaging, lab work, and nerve conduction tests) Review of prior analgesic pharmacotherapies. Reviewing PMP Interpreting ordered tests (e.g., lab work, imaging, nerve  conduction tests) Performing post-procedure evaluations, including interpretation of diagnostic procedures Obtaining and/or reviewing separately obtained history Performing a medically appropriate examination and/or evaluation Counseling and educating the patient/family/caregiver Ordering medications, tests, or procedures Referring and communicating with other health care professionals (when not separately reported) Documenting clinical information in the electronic or other health record Independently interpreting results (not separately reported) and communicating results to the patient/ family/caregiver Care coordination (not separately reported)  Note by: Demetre Monaco K Hammad Finkler, NP (TTS and AI technology used. I apologize for any typographical errors that were not detected and corrected.) Date: 08/07/2024; Time: 1:59 PM

## 2024-10-14 ENCOUNTER — Encounter: Payer: Self-pay | Admitting: Podiatry

## 2024-10-14 ENCOUNTER — Ambulatory Visit: Admitting: Podiatry

## 2024-10-14 DIAGNOSIS — B351 Tinea unguium: Secondary | ICD-10-CM

## 2024-10-14 DIAGNOSIS — L84 Corns and callosities: Secondary | ICD-10-CM

## 2024-10-14 DIAGNOSIS — Q828 Other specified congenital malformations of skin: Secondary | ICD-10-CM | POA: Diagnosis not present

## 2024-10-14 DIAGNOSIS — M79675 Pain in left toe(s): Secondary | ICD-10-CM | POA: Diagnosis not present

## 2024-10-14 DIAGNOSIS — M79674 Pain in right toe(s): Secondary | ICD-10-CM | POA: Diagnosis not present

## 2024-10-14 DIAGNOSIS — G6289 Other specified polyneuropathies: Secondary | ICD-10-CM

## 2024-10-14 DIAGNOSIS — N183 Chronic kidney disease, stage 3 unspecified: Secondary | ICD-10-CM

## 2024-10-20 ENCOUNTER — Encounter: Payer: Self-pay | Admitting: Podiatry

## 2024-10-20 NOTE — Progress Notes (Signed)
  Subjective:  Patient ID: Clinton Thomas, male    DOB: 1946/05/06,  MRN: [id number]  Clinton Thomas presents to clinic today for at risk foot care with history of peripheral neuropathy and painful mycotic toenails x 10 which interfere with daily activities. Pain is relieved with periodic professional debridement.  Chief Complaint  Patient presents with   Toe Pain    He saw DR. Trivedi in Oct. Denies being diabetic   New problem(s): None.   PCP is Trivedi, Mehul K, MD.  Allergies  Allergen Reactions   Latanoprost     Other Reaction(s): Pain in eye   Linezolid Other (See Comments)    unknown    Review of Systems: Negative except as noted in the HPI.  Objective: No changes noted in today's physical examination. There were no vitals filed for this visit. Clinton Thomas is a pleasant 78 y.o. male in NAD. AAO x 3.   Assessment/Plan: 1. Pain due to onychomycosis of toenails of both feet   2. Callus   3. Stage 3 chronic kidney disease, unspecified whether stage 3a or 3b CKD (HCC)   4. Other polyneuropathy   -Patient was evaluated today. All questions/concerns addressed on today's visit. -Continue foot and shoe inspections daily. Monitor blood glucose per PCP/Endocrinologist's recommendations. -Patient to continue soft, supportive shoe gear daily. -Toenails 1-5 b/l were debrided in length and girth with sterile nail nippers and dremel without iatrogenic bleeding.  -Callus(es) submet head 5 left foot pared utilizing sterile scalpel blade without complication or incident. Total number debrided =1. -Porokeratotic lesion(s) distal tip of left 3rd toe pared and enucleated with sterile currette without incident. Total number of lesions debrided=1. -Patient/POA to call should there be question/concern in the interim.   Return in about 3 months (around 01/14/2025).  Clinton Thomas, DPM      [place] LOCATION: [address] [place] [address]                   Office [phone]   [facility] LOCATION: [address] [place], [place] [address] Office [phone]

## 2024-10-25 DIAGNOSIS — Z79899 Other long term (current) drug therapy: Secondary | ICD-10-CM | POA: Insufficient documentation

## 2024-10-25 DIAGNOSIS — M5412 Radiculopathy, cervical region: Secondary | ICD-10-CM | POA: Insufficient documentation

## 2024-10-25 DIAGNOSIS — M47812 Spondylosis without myelopathy or radiculopathy, cervical region: Secondary | ICD-10-CM | POA: Insufficient documentation

## 2024-10-29 ENCOUNTER — Ambulatory Visit: Admitting: Nurse Practitioner

## 2024-10-29 DIAGNOSIS — M5481 Occipital neuralgia: Secondary | ICD-10-CM

## 2024-10-29 DIAGNOSIS — M47812 Spondylosis without myelopathy or radiculopathy, cervical region: Secondary | ICD-10-CM

## 2024-10-29 DIAGNOSIS — G894 Chronic pain syndrome: Secondary | ICD-10-CM

## 2024-10-29 DIAGNOSIS — Z79899 Other long term (current) drug therapy: Secondary | ICD-10-CM

## 2024-10-29 DIAGNOSIS — M5412 Radiculopathy, cervical region: Secondary | ICD-10-CM

## 2024-10-29 NOTE — Progress Notes (Signed)
  10/29/2024-No show due to weather

## 2024-10-30 NOTE — Progress Notes (Signed)
 PROVIDER NOTE: Interpretation of information contained herein should be left to medically-trained personnel. Specific patient instructions are provided elsewhere under Patient Instructions section of medical record. This document was created in part using AI and STT-dictation technology, any transcriptional errors that may result from this process are unintentional.  Patient: Clinton Thomas  Service: E/M   PCP: Jesusa Junita POUR, MD  DOB: Jan 06, 1946  DOS: 10/31/2024  Provider: Emmy POUR Blanch, NP  MRN: [id number]  Delivery: Face-to-face  Specialty: Interventional Pain Management  Type: Established Patient  Setting: Ambulatory outpatient facility  Specialty designation: 09  Referring Prov.: Trivedi, Mehul K, MD  Location: Outpatient office facility       History of present illness (HPI) [name], a 78 y.o. year old male, is here today because of his back pain, neck pain. [name] primary complain today is Back Pain and Neck Pain  Pertinent problems: Clinton Thomas has Cervico-occipital neuralgia; CKD (chronic kidney disease) stage 3, GFR 30-59 ml/min (HCC); Cervical facet joint syndrome; Chronic pain syndrome; Cervical radicular pain; Medication management; and Cervical spondylosis on their pertinent problem list.  Pain Assessment: Severity of Chronic pain is reported as a 8 /10. Location: Neck Mid/Down Back. Onset: More than a month ago. Quality: Aching. Timing: Constant. Modifying factor(s): Denies. Vitals:  height is 6' 1 (1.854 m) and weight is 225 lb (102.1 kg). His temporal temperature is 97.2 F (36.2 C) (abnormal). His blood pressure is 113/61 and his pulse is 61. His oxygen saturation is 100%.  BMI: Estimated body mass index is 29.69 kg/m as calculated from the following:   Height as of this encounter: 6' 1 (1.854 m).   Weight as of this encounter: 225 lb (102.1 kg).  Last encounter: 10/29/2024. Last procedure: Visit date not found.  Reason for encounter: medication  management. No change in medical history since last visit.  Patient's pain is at baseline.  Patient continues multimodal pain regimen as prescribed.  States that it provides pain relief and improvement in functional status.   Discussed the use of AI scribe software for clinical note transcription with the patient, who gave verbal consent to proceed.  History of Present Illness   [name] is a 78 year old male who presents with chronic pain management.  He experiences chronic pain primarily in his lower back and occasionally in his neck. The patient often reports his pain as an eight out of ten, but it is not always at that level according to the person accompanying him. He sometimes wakes up with a headache, which he describes as a sharp pain in his head. The severity of these headaches has decreased, and he no longer experiences tears from the pain.  He is currently using a Butrans  patch for pain management and reports no side effects. The medication is dispensed through the [facility] pharmacy in [place].  No new symptoms or side effects from his current medication regimen.     Pharmacotherapy Assessment   Butrans  7.5 mcg/h, 1 patch placed onto the skin once a week. Monitoring: [place] PMP: PDMP reviewed during this encounter.       Pharmacotherapy: No side-effects or adverse reactions reported. Compliance: No problems identified. Effectiveness: Clinically acceptable.  [name], [place]  10/31/2024  8:42 AM  Sign when Signing Visit Nursing Pain Medication Assessment:  Safety precautions to be maintained throughout the outpatient stay will include: orient to surroundings, keep bed in low position, maintain call bell within reach at all times,  provide assistance with transfer out of bed and ambulation.  Medication Inspection Compliance: Clinton Thomas did not comply with our request to bring his pills to be counted. He was reminded that bringing the medication bottles, even when  empty, is a requirement.  Medication: None brought in. Pill/Patch Count: None available to be counted. Bottle Appearance: No container available. Did not bring bottle(s) to appointment. Filled Date: N/A Last Medication intake:  Today    UDS:  No results found for: SUMMARY  No results found for: CBDTHCR No results found for: D8THCCBX No results found for: D9THCCBX  ROS  Constitutional: Denies any fever or chills Gastrointestinal: No reported hemesis, hematochezia, vomiting, or acute GI distress Musculoskeletal: Low back pain, neck pain Neurological: No reported episodes of acute onset apraxia, aphasia, dysarthria, agnosia, amnesia, paralysis, loss of coordination, or loss of consciousness  Medication Review  albuterol , allopurinol , amLODipine , aspirin EC, atorvastatin , benzonatate , buprenorphine , carvedilol , chlorthalidone , diclofenac Sodium, docusate sodium , fluticasone , gabapentin , guaifenesin, ketoconazole , lisinopril, memantine, menthol, pantoprazole , sertraline , sodium bicarbonate, and traZODone   History Review  Allergy: Clinton Thomas is allergic to latanoprost and linezolid. [name]  reports no history of drug use. Alcohol:  reports that he does not currently use alcohol. Tobacco:  reports that he has quit smoking. He has never used smokeless tobacco. Social: Clinton Thomas  reports that he has quit smoking. He has never used smokeless tobacco. He reports that he does not currently use alcohol. He reports that he does not use drugs. Medical:  has a past medical history of Asthma, Atrial flutter (HCC), Colon polyp, Gout, Hepatitis C, echocardiogram, Hypertension, Microcytic anemia, Peripheral neuropathy, and Thrombocytopenia. Surgical: Clinton Thomas  has a past surgical history that includes Revision total knee arthroplasty; Back surgery; and Cervical disc surgery. Family: family history includes Arthritis in his sister; COPD in his father; Stroke in his  mother.  Laboratory Chemistry Profile   Renal Lab Results  Component Value Date   BUN 31 (H) 02/28/2019   CREATININE 1.78 (H) 02/28/2019   GFRAA 43 (L) 02/28/2019   GFRNONAA 37 (L) 02/28/2019    Hepatic Lab Results  Component Value Date   AST 26 02/27/2019   ALT 22 02/27/2019   ALBUMIN 3.9 02/27/2019   ALKPHOS 64 02/27/2019   LIPASE 66 (H) 02/27/2018    Electrolytes Lab Results  Component Value Date   NA 140 02/28/2019   K 4.3 02/28/2019   CL 110 02/28/2019   CALCIUM  9.4 02/28/2019   MG 2.1 02/27/2019    Bone No results found for: VD25OH, VD125OH2TOT, CI6874NY7, CI7874NY7, 25OHVITD1, 25OHVITD2, 25OHVITD3, TESTOFREE, TESTOSTERONE  Inflammation (CRP: Acute Phase) (ESR: Chronic Phase) Lab Results  Component Value Date   ESRSEDRATE 52 (H) 10/04/2010         Note: Above Lab results reviewed.  Recent Imaging Review  DG PAIN CLINIC C-ARM 1-60 MIN NO REPORT Fluoro was used, but no Radiologist interpretation will be provided.  Please refer to NOTES tab for provider progress note. Note: Reviewed        Physical Exam  Vitals: BP 113/61 (BP Location: Right Arm, Patient Position: Sitting, Cuff Size: Large)   Pulse 61   Temp (!) 97.2 F (36.2 C) (Temporal)   Ht 6' 1 (1.854 m)   Wt 225 lb (102.1 kg)   SpO2 100%   BMI 29.69 kg/m  BMI: Estimated body mass index is 29.69 kg/m as calculated from the following:   Height as of this encounter: 6' 1 (1.854 m).   Weight as  of this encounter: 225 lb (102.1 kg). Ideal: Ideal body weight: 79.9 kg (176 lb 2.4 oz) Adjusted ideal body weight: 88.8 kg (195 lb 11 oz) General appearance: Well nourished, well developed, and well hydrated. In no apparent acute distress Mental status: Alert, oriented x 3 (person, place, & time)       Respiratory: No evidence of acute respiratory distress Eyes: PERLA  Musculoskeletal: +LBP Neck pain Assessment   Diagnosis Status  1. Cervical radicular pain   2. Chronic pain  syndrome   3. Medication management   4. Cervico-occipital neuralgia   5. Cervical spondylosis    Controlled Controlled Controlled   Updated Problems: No problems updated.  Plan of Care  Problem-specific:  Assessment and Plan    Chronic pain syndrome Pain primarily in lower back and neck, occasionally severe but generally well-controlled. No significant medication side effects. - Continue current pain management regimen.  Cervico-occipital neuralgia Intermittent neuralgia with occasional headaches, well-managed with current treatment. - Continue current treatment for cervico-occipital neuralgia.  Medication management for chronic pain Butrans  patch effective for pain management without significant side effects. Prescription sent to [facility] pharmacy. - Sent prescription for Butrans  patch to [facility] pharmacy in [place] for a three-month supply.  Patient's pain is controlled with Butrans  patch, will continue on current medication regimen.  Prescribing drug monitoring (PDMP) reviewed, findings consistent with the use of prescribed medication and no evidence of narcotic misuse or abuse.  Urine drug screening (UDS) up to date.  No side effects or adverse reaction reported to medication.  Schedule follow-up in 90 days for medication management.      Clinton Thomas has a current medication list which includes the following long-term medication(s): albuterol , allopurinol , atorvastatin , carvedilol , chlorthalidone , fluticasone , gabapentin , memantine, pantoprazole , and sertraline .  Pharmacotherapy (Medications Ordered): Meds ordered this encounter  Medications   buprenorphine  (BUTRANS ) 7.5 MCG/HR    Sig: Place 1 patch onto the skin once a week.    Dispense:  4 patch    Refill:  2    Chronic Pain: STOP Act (Not applicable) Fill 1 day early if closed on refill date. Avoid benzodiazepines within 8 hours of opioids   Orders:  No orders of the defined types were placed in this encounter.        Return in about 3 months (around 01/29/2025) for (F2F), (MM), Emmy Blanch NP.    Recent Visits Date Type Provider Dept  10/29/24 Office Visit Hercules Hasler K, NP [facility]  08/07/24 Office Visit Austine Wiedeman K, NP [facility]  Showing recent visits within past 90 days and meeting all other requirements Today's Visits Date Type Provider Dept  10/31/24 Office Visit Sabirin Baray K, NP [facility]  Showing today's visits and meeting all other requirements Future Appointments Date Type Provider Dept  01/28/25 Appointment Terrius Gentile K, NP [facility]  Showing future appointments within next 90 days and meeting all other requirements  I discussed the assessment and treatment plan with the patient. The patient was provided an opportunity to ask questions and all were answered. The patient agreed with the plan and demonstrated an understanding of the instructions.  Patient advised to call back or seek an in-person evaluation if the symptoms or condition worsens.  I personally spent a total of 30 minutes in the care of the patient today including preparing to see the patient, getting/reviewing separately obtained history, performing a medically appropriate exam/evaluation, counseling and educating, placing orders, referring and communicating with other  health care professionals, documenting clinical information in the EHR, independently interpreting results, communicating results, and coordinating care.   Note by: Marua Qin K Cort Dragoo, NP (TTS and AI technology used. I apologize for any typographical errors that were not detected and corrected.) Date: 10/31/2024; Time: 9:41 AM

## 2024-10-31 ENCOUNTER — Encounter: Payer: Self-pay | Admitting: Nurse Practitioner

## 2024-10-31 ENCOUNTER — Ambulatory Visit: Attending: Nurse Practitioner | Admitting: Nurse Practitioner

## 2024-10-31 DIAGNOSIS — M5412 Radiculopathy, cervical region: Secondary | ICD-10-CM | POA: Insufficient documentation

## 2024-10-31 DIAGNOSIS — G894 Chronic pain syndrome: Secondary | ICD-10-CM | POA: Insufficient documentation

## 2024-10-31 DIAGNOSIS — M5481 Occipital neuralgia: Secondary | ICD-10-CM | POA: Insufficient documentation

## 2024-10-31 DIAGNOSIS — Z79899 Other long term (current) drug therapy: Secondary | ICD-10-CM | POA: Diagnosis not present

## 2024-10-31 DIAGNOSIS — M47812 Spondylosis without myelopathy or radiculopathy, cervical region: Secondary | ICD-10-CM | POA: Insufficient documentation

## 2024-10-31 MED ORDER — BUPRENORPHINE 7.5 MCG/HR TD PTWK: 1.0000 | MEDICATED_PATCH | TRANSDERMAL | 2 refills | Status: AC

## 2024-10-31 NOTE — Progress Notes (Signed)
 Nursing Pain Medication Assessment:  Safety precautions to be maintained throughout the outpatient stay will include: orient to surroundings, keep bed in low position, maintain call bell within reach at all times, provide assistance with transfer out of bed and ambulation.  Medication Inspection Compliance: Clinton Thomas did not comply with our request to bring his pills to be counted. He was reminded that bringing the medication bottles, even when empty, is a requirement.  Medication: None brought in. Pill/Patch Count: None available to be counted. Bottle Appearance: No container available. Did not bring bottle(s) to appointment. Filled Date: N/A Last Medication intake:  Today

## 2024-10-31 NOTE — Patient Instructions (Signed)
 ______________________________________________________________________    Opioid Pain Medication Update  To: All patients taking opioid pain medications. (I.e.: hydrocodone, hydromorphone, oxycodone, oxymorphone, morphine, codeine, methadone, tapentadol, tramadol, buprenorphine , fentanyl , etc.)  Re: Updated review of side effects and adverse reactions of opioid analgesics, as well as new information about long term effects of this class of medications.  Direct risks of long-term opioid therapy are not limited to opioid addiction and overdose. Potential medical risks include serious fractures, breathing problems during sleep, hyperalgesia, immunosuppression, chronic constipation, bowel obstruction, myocardial infarction, and tooth decay secondary to xerostomia.  Unpredictable adverse effects that can occur even if you take your medication correctly: Cognitive impairment, respiratory depression, and death. Most people think that if they take their medication correctly, and as instructed, that they will be safe. Nothing could be farther from the truth. In reality, a significant amount of recorded deaths associated with the use of opioids has occurred in individuals that had taken the medication for a long time, and were taking their medication correctly. The following are examples of how this can happen: Patient taking his/her medication for a long time, as instructed, without any side effects, is given a certain antibiotic or another unrelated medication, which in turn triggers a Drug-to-drug interaction leading to disorientation, cognitive impairment, impaired reflexes, respiratory depression or an untoward event leading to serious bodily harm or injury, including death.  Patient taking his/her medication for a long time, as instructed, without any side effects, develops an acute impairment of liver and/or kidney function. This will lead to a rapid inability of the body to breakdown and eliminate  their pain medication, which will result in effects similar to an overdose, but with the same medicine and dose that they had always taken. This again may lead to disorientation, cognitive impairment, impaired reflexes, respiratory depression or an untoward event leading to serious bodily harm or injury, including death.  A similar problem will occur with patients as they grow older and their liver and kidney function begins to decrease as part of the aging process.  Background information: Historically, the original case for using long-term opioid therapy to treat chronic noncancer pain was based on safety assumptions that subsequent experience has called into question. In 1996, the American Pain Society and the American Academy of Pain Medicine issued a consensus statement supporting long-term opioid therapy. This statement acknowledged the dangers of opioid prescribing but concluded that the risk for addiction was low; respiratory depression induced by opioids was short-lived, occurred mainly in opioid-naive patients, and was antagonized by pain; tolerance was not a common problem; and efforts to control diversion should not constrain opioid prescribing. This has now proven to be wrong. Experience regarding the risks for opioid addiction, misuse, and overdose in community practice has failed to support these assumptions.  According to the Centers for Disease Control and Prevention, fatal overdoses involving opioid analgesics have increased sharply over the past decade. Currently, more than 96,700 people die from drug overdoses every year. Opioids are a factor in 7 out of every 10 overdose deaths. Deaths from drug overdose have surpassed motor vehicle accidents as the leading cause of death for individuals between the ages of 37 and 73.  Clinical data suggest that neuroendocrine dysfunction may be very common in both men and women, potentially causing hypogonadism, erectile dysfunction, infertility,  decreased libido, osteoporosis, and depression. Recent studies linked higher opioid dose to increased opioid-related mortality. Controlled observational studies reported that long-term opioid therapy may be associated with increased risk for cardiovascular events. Subsequent  meta-analysis concluded that the safety of long-term opioid therapy in elderly patients has not been proven.   Side Effects and adverse reactions: Common side effects: Drowsiness (sedation). Dizziness. Nausea and vomiting. Constipation. Physical dependence -- Dependence often manifests with withdrawal symptoms when opioids are discontinued or decreased. Tolerance -- As you take repeated doses of opioids, you require increased medication to experience the same effect of pain relief. Respiratory depression -- This can occur in healthy people, especially with higher doses. However, people with COPD, asthma or other lung conditions may be even more susceptible to fatal respiratory impairment.  Uncommon side effects: An increased sensitivity to feeling pain and extreme response to pain (hyperalgesia). Chronic use of opioids can lead to this. Delayed gastric emptying (the process by which the contents of your stomach are moved into your small intestine). Muscle rigidity. Immune system and hormonal dysfunction. Quick, involuntary muscle jerks (myoclonus). Arrhythmia. Itchy skin (pruritus). Dry mouth (xerostomia).  Long-term side effects: Chronic constipation. Sleep-disordered breathing (SDB). Increased risk of bone fractures. Hypothalamic-pituitary-adrenal dysregulation. Increased risk of overdose.  RISKS: Respiratory depression and death: Opioids increase the risk of respiratory depression and death.  Drug-to-drug interactions: Opioids are relatively contraindicated in combination with benzodiazepines, sleep inducers, and other central nervous system depressants. Other classes of medications (i.e.: certain antibiotics  and even over-the-counter medications) may also trigger or induce respiratory depression in some patients.  Medical conditions: Patients with pre-existing respiratory problems are at higher risk of respiratory failure and/or depression when in combination with opioid analgesics. Opioids are relatively contraindicated in some medical conditions such as central sleep apnea.   Fractures and Falls:  Opioids increase the risk and incidence of falls. This is of particular importance in elderly patients.  Endocrine System:  Long-term administration is associated with endocrine abnormalities (endocrinopathies). (Also known as Opioid-induced Endocrinopathy) Influences on both the hypothalamic-pituitary-adrenal axis?and the hypothalamic-pituitary-gonadal axis have been demonstrated with consequent hypogonadism and adrenal insufficiency in both sexes. Hypogonadism and decreased levels of dehydroepiandrosterone sulfate have been reported in men and women. Endocrine effects include: Amenorrhoea in women (abnormal absence of menstruation) Reduced libido in both sexes Decreased sexual function Erectile dysfunction in men Hypogonadisms (decreased testicular function with shrinkage of testicles) Infertility Depression and fatigue Loss of muscle mass Anxiety Depression Immune suppression Hyperalgesia Weight gain Anemia Osteoporosis Patients (particularly women of childbearing age) should avoid opioids. There is insufficient evidence to recommend routine monitoring of asymptomatic patients taking opioids in the long-term for hormonal deficiencies.  Immune System: Human studies have demonstrated that opioids have an immunomodulating effect. These effects are mediated via opioid receptors both on immune effector cells and in the central nervous system. Opioids have been demonstrated to have adverse effects on antimicrobial response and anti-tumour surveillance. Buprenorphine  has been demonstrated to have  no impact on immune function.  Opioid Induced Hyperalgesia: Human studies have demonstrated that prolonged use of opioids can lead to a state of abnormal pain sensitivity, sometimes called opioid induced hyperalgesia (OIH). Opioid induced hyperalgesia is not usually seen in the absence of tolerance to opioid analgesia. Clinically, hyperalgesia may be diagnosed if the patient on long-term opioid therapy presents with increased pain. This might be qualitatively and anatomically distinct from pain related to disease progression or to breakthrough pain resulting from development of opioid tolerance. Pain associated with hyperalgesia tends to be more diffuse than the pre-existing pain and less defined in quality. Management of opioid induced hyperalgesia requires opioid dose reduction.  Cancer: Chronic opioid therapy has been associated with an increased risk  of cancer among noncancer patients with chronic pain. This association was more evident in chronic strong opioid users. Chronic opioid consumption causes significant pathological changes in the small intestine and colon. Epidemiological studies have found that there is a link between opium dependence and initiation of gastrointestinal cancers. Cancer is the second leading cause of death after cardiovascular disease. Chronic use of opioids can cause multiple conditions such as GERD, immunosuppression and renal damage as well as carcinogenic effects, which are associated with the incidence of cancers.   Mortality: Long-term opioid use has been associated with increased mortality among patients with chronic non-cancer pain (CNCP).  Prescription of long-acting opioids for chronic noncancer pain was associated with a significantly increased risk of all-cause mortality, including deaths from causes other than overdose.  Reference: Von Korff M, Kolodny A, Deyo RA, Chou R. Long-term opioid therapy reconsidered. Ann Intern Med. 2011 Sep 6;155(5):325-8. doi:  10.7326/[id number]. PMID: 78106373; PMCID: [id number]. Kit JINNY Laurence CINDERELLA Pearley JINNY, Hayward RA, Dunn KM, Jordan KP. Risk of adverse events in patients prescribed long-term opioids: A cohort study in the [place] Clinical Practice Research [employer]. Eur J Pain. 2019 May;23(5):908-922. doi: 10.1002/ejp.1357. Epub 2019 Jan 31. PMID: 69379883. Colameco S, Coren JS, Ciervo CA. Continuous opioid treatment for chronic noncancer pain: a time for moderation in prescribing. Postgrad Med. 2009 Jul;121(4):61-6. doi: 10.3810/pgm.[id number]. PMID: [id number]. Gigi JONELLE Shlomo MILUS Levern IVER Conny RN, [place], Blazina I, Lonell DASEN, Bougatsos C, Deyo RA. The effectiveness and risks of long-term opioid therapy for chronic pain: a systematic review for a [employer] of [employer] Pathways to [employer]. Ann Intern Med. 2015 Feb 17;162(4):276-86. doi: 10.7326/M14-2559. PMID: 74418742. Rory CHRISTELLA Laurence [facility], [place] DM. NCHS Data Brief No. 22. [place]: Centers for Disease Control and Prevention; 2009. Sep, Increase in Fatal Poisonings Involving Opioid Analgesics in the [place] , 1999-2006. Song IA, Choi HR, Oh TK. Long-term opioid use and mortality in patients with chronic non-cancer pain: Ten-year follow- ______________________________________________________________________    Update on Controlled Substance (Opioid) Regulations   To: All patients taking opioid pain medications. (I.e.: hydrocodone, hydromorphone, oxycodone, oxymorphone, morphine, codeine, methadone, tapentadol, tramadol, buprenorphine , fentanyl , etc.)  Re: Review on the state of controlled substance regulations.  Introduction: Rules and regulations associated with all aspects of controlled substances are constantly being modified. Unfortunately we have encountered patients questioning the veracity of the information that we provide them about these changes. This is intended to provide them with appropriate references and a  historical review of these changes.  A Brief History: As of September 05, 2016, the [place]  Government declared the opioid epidemic a public health emergency. Prescription drug monitoring programs (PDMPs) and the [facility] All Schedules Prescription Electronic Reporting Act (NASPER). Before 1800, clinicians regarded pain as an existential phenomenon, a consequence of aging. There was no regulation on the use of cocaine and opioids, resulting in widespread marketing and prescribing for many ailments ranging from diarrhea to toothache. The [employer] of [phone], passed in response to the sudden emergence of street heroin abuse as well as iatrogenic morphine dependence, influenced both physician and patient alike to avoid opiates. Patients with unexplained pain in the 1920s were regarded as deluded, malingering, or abusers, and cancer patients through the 1950s were encouraged to wean themselves off opioids until their lives could be measured in weeks. Alongside this opioid evolution, the American Pain Society launched their influential pain as the fifth vital sign campaign in 1995. Concurrently, pharmaceutical companies introduced new formulations, such as extended release oxycodone (OxyContin).  From 11-09-1996 to November 09, 2001, OxyContin prescriptions increased from 670,000 to 6.2 million. However, concerns soon began to surface regarding overzealous opioid treatment. It must be noted that pharmaceutical companies contributed significantly to the rise of the opioid epidemic, receiving considerable reprimands as a consequence. In 2006-11-09, as the opioid epidemic began to inflict profound damage, [employer] pleaded guilty to federal charges related to the misbranding of OxyContin. [employer] agreed to pay a total of $634.5 million to resolve Justice Department investigations, as well as a $19.5 million settlement to [address] and the [address] of [place].  In response to the current epidemic, changes in focus to the  development of new abuse deterrent opioid formulations at the [place]  Food and Drug Administration (FDA) as well as drafting of new public standards for pain treatment were created at [facility] in Nov 09, 2016. In response to the opioid epidemic, FDA public policy changes were announced in February 2016. Among these new positions were a re-examination of the risk-benefit paradigm for opioids with strict emphasis on the large public health ramifications. The various modified opioids released over the past 20 years, such as tamper-resistant preparation, have had differing levels of success, and are collectively referred to as Risk Evaluation and Mitigation Strategies (REMS). There is also a growing focus on preventing opioid use disorder (OUD) and on offering affected individuals accessible and effective treatment. [place]  government policy reflects these changes and both the Affordable Care Act and the Mental Health Parity and Addiction Equity Act were major steps forward in treating opioid addiction. The Affordable Care Act, which was signed into law in 2009/11/09, with major provisions coming into effect by Nov 09, 2013.  In the 1990s, the intensified marketing of newly reformulated prescription opioid medications (e.g., OxyContin) and an influential pain advocacy campaign that encouraged greater pain management led to a precipitous rise in opioid use in the [place] . Research from the Centers for Disease Control and Prevention (CDC) shows that prescription opioid sales in the [place]  quadrupled from Nov 09, 1998 to 2009-11-09. At the same time, opioid misuse and opioid-involved overdose deaths increased (Figure 1). Between 1998/11/09 and November 09, 2009, the rate of opioidinvolved overdose deaths in the [place]  doubled from 2.9 to 6.8 deaths per 100,000 people. This initial rise in opioid-related deaths is often referred to as the first wave of the recent opioid crisis.  Between 1998/11/09 and 2019/11/10, 565,000 Americans died of opioid-involved overdoses. In  turn, federal, state, and local governments responded with various legal and policy efforts to curb opioid misuse and drug-related overdose Deaths.  Recent Congresses have enacted several laws addressing the opioid crisis, such as the Comprehensive Addiction and Recovery Act of 11-10-15 (CARA, P.L. 114-198); the 21st Century Cures Act (P.L. 114-255); the Substance UseDisorder Prevention that Promotes Opioid Recovery and Treatment for Patients and Communities Act (SUPPORT Act, P.L. [phone]); the Fentanyl  Sanctions Act (Title LXXII of P.L. [id number]); and the Blocking Deadly Fentanyl  Imports Act (P.L. 117-81, 6610). These laws addressed overprescribing and misuse of opioids, expanded substance use disorder prevention and treatment capacities, bolstered drug diversion capabilities, and enhanced international drug interdiction, counternarcotics cooperation, and sanctions efforts. Congress also directed additional funds to many of these initiatives through appropriations.  Congress provided funding in the [employer] Act of 11-09-20 [phone]; P.L. 117-2) for syringe services programs (often known as needle exchange programs) and other harm reduction initiatives. Federal and state harm reduction strategies have frequently involved the distribution of naloxone (e.g., Narcan)--a medication used to reverse an opioid overdose--and test strips used to detect fentanyl   in drug samples.  The Department of Justice (DOJ) and Department of Homeland Security [facility]) aim to reduce the diversion of prescription opioids and the use, manufacturing, and trafficking of illicit opioids. DOJ--via the Drug Enforcement Administration (DEA)--regulates opioid manufacturers, distributors, and dispensers; it also controls the opioid supply through enforcement of regulatory requirements.  A History of Opiate Laws in the [place]   Prior to 1890, laws concerning opiates were strictly imposed on a local city or  state-by-state basis. One of the first was in [place] in 1875 where it became illegal to smoke opium only in opium dens. It did not ban the sale, import or use otherwise. In the next 25 years different states enacted opium laws ranging from outlawing opium dens altogether to making possession of opium, morphine and heroin without a physicians prescription illegal.  The first Congressional Act took place in 1890 that levied taxes on morphine and opium. From that time on the [employer] has had a series of laws and acts directly aimed at opiate use, abuse and control. These are outlined below:  1906 - Pure Food and Drug Act Preventing the manufacture, sale, or transportation of adulterated or misbranded or poisonous or deleterious foods, drugs, medicines, and liquors, and for regulating traffic therein, and for other purposes. Punishment included fines and prison time.  1909   - Smoking Opium Exclusion Act Banned the importation, possession and use of smoking opium. Did not regulate opium-based medications. First Freight forwarder banning the non-medical use of a substance.  1914  - The Margrette Act In summary, The Margrette Act of 1914 was written more to have all parties involved in importing, exporting, set designer and distributing opium or cocaine to register with the [employer] and have taxes levied upon them. Exempt from the law were physicians operating in the course of his professional practice  1919 - Supreme Court ratified the Bj's in [place] [place]. [place]  and [place]  v. [place], then again in [facility] v. [place] , in 1920, holding that doctors may not prescribe maintenance supplies of narcotics to people addicted to narcotics. However, it does not prohibit doctors from prescribing narcotics to wean a patient off of the drug. It was also the opinion of the court that prescribing narcotics to habitual users was not considered  professional practice hence it then was considered illegal for doctors to prescribe opioids for the purposes of maintaining an addiction. It can be argued that todays addiction medications are not intended to maintain an addiction but to facilitate addiction remission. In which case, this opinion of the court should not preclude practitioners from prescribing buprenorphine  or methadone to patients suffering from an addictive disorder.  1924  - Heroin Act Architectural technologist, importation and possession of heroin illegal - even for medicinal use.  1922 -- Narcotic Drug Import and Export Act Enacted to assure proper control of importation, sale, possession, production and consumption of narcotics.  1927  -- Special Educational Needs Teacher of Prohibition [employer] of Prohibition was responsible for tracking bootleggers and organized conservation officer, historic buildings. They focused primarily on interstate and international cases and those cases where local law enforcement official would not or could not act.  1932 -- Uniform State Narcotic Act Encouraged states to pass uniform state laws matching the federal Narcotic Drug Import and Export Act. Suggested prohibiting cannabis use at the state level.  40 -- Food, Drug, and Cosmetic Act The new law brought cosmetics and medical devices under control, and it required  that drugs be labeled with adequate directions for safe use. Moreover, it mandated pre-market approval of all new drugs, such that a manufacturer would have to prove to FDA that a drug were safe before it could be sold  1951 -- Boggs Act Imposed maximum criminal penalties for violations of the import/export and internal revenue laws related to drugs and also established mandatory minimum prison sentences.  1956 -- Narcotics Control Act Increased Boggs Act penalties and mandatory prison sentence minimums for violations of existing drug laws.  1965 -- Drug Abuse Control Amendment Enacted to deal with problems caused by abuse  of depressants, stimulants and hallucinogens. Restricted research into psychoactive drugs such as LSD by requiring FDA approval.  1970 -- Controlled Substance Act  Controlled Substances Import and Export Act These laws are a consolidation of numerous laws regulating the manufacture and distribution of narcotics, stimulants, depressants, hallucinogens, anabolic steroids, and chemicals used in the illicit production of controlled substances. The CSA places all substances that are regulated under existing federal law into one of five schedules. This placement is based upon the substance's medicinal value, harmfulness, and potential for abuse or addiction. Schedule I is reserved for the most dangerous drugs that have no recognized medical use, while Schedule V is the classification used for the least dangerous drugs. The act also provides a mechanism for substances to be controlled, added to a schedule, decontrolled, removed from control, rescheduled, or transferred from one schedule to another.  58 - Drug Enforcement Agency By Executive Order, the DEA was formed to take place of the [employer] of Narcotics and Dangerous Drugs.  45 -Narcotic Addict Treatment Act of  1974  - Public Law [phone] Amends the Controlled Substance Act of 1970 to provide for the registration of practitioners conducting narcotic treatment programs. [methadone clinics] It also provides legal definitions for the phrases maintenance treatment and detoxification treatment.  1986 -- Anti-Drug Abuse Act of 1986 Strengthened Federal efforts to encourage foreign cooperation in eradicating illicit drug crops and in halting international drug traffic, to improve enforcement of Federal drug laws and enhance interdiction of illicit drug shipments, to provide strong Market researcher in establishing effective drug abuse prevention and education programs, to [employer] support for drug abuse treatment and rehabilitation efforts, and for  other purposes. It also re-imposed mandatory sentencing minimums depending on which drug and how much was involved.  1988 -- Anti-Drug Abuse Act of 1988 Established the Office of Materials Engineer (ONDCP) in the [employer] of the Economist; authorized funds for [employer], state and local drug enforcement activities, school-based drug prevention efforts, and drug abuse treatment with special emphasis on injecting drug abusers at high risk for AIDS.  2000 -- Federal - The Drug Addiction Treatment Act of 2000 (DATA 2000) It enables qualified physicians to prescribe and/or dispense narcotics for the purpose of treating opioid dependency. For the first time, physicians are able to treat this disease from their private offices or other clinical settings. This presents a very desirable treatment option for those who are unwilling or unable to seek help in drug treatment clinics. Patients can now be treated in the privacy of their doctors office, as are other people being treated for any other type of medical condition. One medicine doctors may now prescribe is Buprenorphine . The major downfall of this Act is the limitation of 30 patients per practice - which means that large facilities, no matter how many physicians are there, can only treat 30 patients at a time.  2002-- DEA reschedules buprenorphine  from a schedule V drug to a schedule III drug, on August 27, 2001 - the day before the FDA approval of Suboxone and Subutex  despite overwhelming objection by the medical community.  2004: June 2004 THE CONFIDENTIALITY OF ALCOHOL AND DRUG ABUSE PATIENT RECORDS REGULATION AND THE HIPAA PRIVACY RULE:  Confidentiality of Alcohol and Drug Dependence Patient Records (summary) Code of Federal Regulations Title 42 Part 2 (42 CFR Part 2)  The confidentiality of alcohol and drug dependence patient records maintained by this practice/program is protected by federal law and regulations. Generally, the  practice/program may not say to a person outside the practice/program that a patient attends the practice/program, or disclose any information identifying a patient as being alcohol or drug dependent unless:  The patient consents in writing; The disclosure is allowed by a court order, or The disclosure is made to medical personnel in a medical emergency or to qualified personnel for research,  audit, or practice/program evaluation. Violation of the federal law and regulations by a practice/program is a crime. Suspected violations may be reported to appropriate authorities in accordance with federal regulations. Freight forwarder and regulations do not protect any information about a crime committed by a patient either at the practice/program or against any person who works for the practice/program or about any threat to commit such a crime. Federal laws and regulations do not protect any information about suspected child abuse or neglect from being reported under state law to appropriate state or local authorities.  sample consent form (MS-WORD)  2005: 06-22-2004 Public law [phone], Amends the Controlled Substances Act to eliminate the 30-patient limit for medical group practices allowed to dispense narcotic drugs in schedules III, IV, or V for maintenance or detoxification treatment (retains the 30-patient limit for an individual physician). This amendment removes the 30-patient limit on group medical practices that treat opioid dependence with buprenorphine . The restriction was part of the original Drug Addiction Treatment Act of 2000 (DATA) that allowed treatment of opioid dependence in a doctor's office. With this change, every certified doctor may now prescribe buprenorphine  up to his or her individual physician limit of 30 patients.  2006: On 11/18/2005 President Levy signed Bill H.R.6344 into law. This allows physicians who have been certified to prescribe certain drugs for the treatment of opioid  dependence under DATA2000 to treat up to 100 patients (up from 30) by submitting an intent notification to the Dept of Health and [employer]. This is a major step forward in both fighting the stigma and allowing access to treatment previously not available to some. For more details see 30/100-PATIENT LIMIT  2016: HHS augments regulations concerning the 30/100 patient limit by raising the limit to 275 for qualifying physicians. Link to summary of regulation  2016: Comprehensive Addiction and Recovery Act of 2016 (sec.303) amends the Controlled Substance Act - to allow Nurse Practitioners and Physician Assistants to become eligible to prescribe buprenorphine  for the treatment of opioid use disorder. See the entire law for more details.  The roots of the concurrent regulation of certain drugs under two statutory schemes go back to the beginning of this century. In 1906, Congress enacted the Pure Food and Drug Act, establishing one regime of regulation to assure (among other things) that drugs were not adulterated or misbranded. These regulations were amended several times, recodified in 1938, and expanded on again from the 1940s through the 1990s. Their implementation and enforcement is today assigned to the Food and Drug Administration (FDA) in the  Department of Health and Health And Safety Inspector [facility]).  In 1914, Congress adopted the [place] Narcotic Act to stop abuse of addictive drugs. The [place] Narcotic Act was amended in 1937 to include marijuana. In 1965, amphetamines, barbiturates, and hallucinogens came under regulation, but under the [employer], Drug, and Cosmetic Act. In 1970, these various statutes were consolidated and recodified as the Controlled Substances Act (CSA), which has been amended several times since then. Its implementation and enforcement is today assigned to the Drug Enforcement Administration (DEA) in the Department of Justice.  The first clash occurred after World War I, when  so-called morphine clinics existed and physicians prescribed or dispensed morphine to addicts. Some addicts were veterans of the American Civil War, the Spanish-American War, and WWI, who had become addicted during treatment for war wounds, but most of them came from the growing population of nonmedical addicts ([place], 8017). The Narcotics Division of the Prohibition Unit of the Department of the Treasury, which was then responsible for enforcing the [facility] Narcotic Act, concluded that this activity was not the legitimate practice of medicine but simple drug trafficking. The Treasury Department swiftly closed the clinics and made it personally and professionally risky for physicians to maintain a narcotic addict for any reason. In did so, however, only after the American Medical Association had adopted a resolution, in 1920, opposing ambulatory clinics''.  In 1972, the public health establishment, including the Secretary of Health, Education, and Welfare, the Education Officer, Environmental, the [employer] of [employer], and the Chemical Engineer for Drug Abuse Prevention, was unprepared to allow [employer] of Narcotics and Dangerous Drugs, DEA's predecessor agency, to unilaterally define the parameters of medical practice for the use of methadone in the treatment of heroin addiction. As a consequence, a new set of rules--the third, on top of the FDA and DEA schemes--was added, one that inserted FDA deeply into the practice of medicine, notwithstanding its protestations to the contrary. Congress ratified this joint responsibility of law enforcement and public health officials for methadone through this third set of rules in 1974 with the passage of the Narcotic Addict Treatment Act (NATA). To examine in detail the evolution of this third set of rules--commonly referred to as the FDA or DHHS methadone regulations--we turn, first, to the period of the mid-1960s.  Increased use of heroin in  the post World War II period first became apparent in the early to mid 1950s. During the [employer], a minimum mandatory narcotics law was enacted in 1956, effective July 1957. 1962 [facility] conference on drug abuse, the [employer] on Narcotic and Drug Abuse (the [employer]) of 1963, the Drug Abuse Control Amendments of 1965, the President's Commission on [employer] and Administration of Justice (the [employer]) of [phone], and the Narcotic Addiction Rehabilitation Act of 1966.  The 1965 Drug Abuse Control Amendments brought under strict federal control all nonnarcotic drugs capable of producing serious psychotoxic effects when abused. This act also created the [employer] of Drug Abuse Control within the Department of Health, Education, and Welfare (DHEW) and shifted the basis for aon corporation of illegal drugs from tax principles (administered by the Department of Treasury) to the regulation of commerce (administered by the [employer]).  The 1966 Narcotic Addiction Rehabilitation Act TOUR MANAGER) authorized the civil commitment of narcotic addicts, and federal assistance to state and local governments to develop a local system of drug treatment programs. With respect to the latter, the [employer] of Mental Health [facility])  initially proposed the gradual implementation of the state assistance effort, mainly through a common mental health mechanism--inpatient treatment programs. However, because of a perceived pressing need, the courts began to commit addicts to these programs even before they were officially opened or staffed. The NARA legislation imposed the following contract requirements on treatment centers: (1) thrice-a-week counseling sessions; (2) weekly urine tests; (3) restorative dental services; (4) psychological consultations and vocational training; and (5) the treatment modalities of drug-free outpatient, therapeutic community, and  methadone maintenance. Reorganization Plan No. 1 of 1968 transferred the primary functions of the [employer] of Narcotics (FBN) from the [employer] to the Department of Justice; it also transferred the [employer] of Drug Abuse Control functions to the Department of Justice. Within the [employer], the [employer] of Narcotics and Dangerous Drugs (BNDD) was created, which became the Drug Enforcement Administration in 1973.   Under the first Dorothy administration [phone]), federal drug abuse policy developed in a significant way. These developments included a 1969 war on drugs presidential message, resulting legislation in 1970, and a Special Action Office created by executive order in 1971 and authorized in statute in 1972. Brynn, in 1969, to send a message to Congress on drug abuse. Although this was the first time that a U.S. president invoked the war on drugs image, it was in retrospect the most balanced approach to the problem of drug abuse that had been advanced. The 1969 message resulted in the submission of legislation to the Congress and the passage, the following year, of the Comprehensive Drug Abuse Prevention and Control Act of 1970 [employer] [phone], September 16, 1969). The act dealt with research, treatment, and prevention of drug abuse and drug dependence, and with drug abuse charity fundraiser. One major purpose of the 1970 legislation was to reverse some of the strictures of the [employer] of 1914. The 1970 act sought to clarify for the medical profession . . . the extent to which they may safely go in treating narcotic addicts as patients. Title I, in Section IV, charged the Surveyor, Minerals, Education, and Welfare, to determine the appropriate methods of professional practice in the medical treatment of the narcotic addiction of various classes of narcotic addicts. This provision constitutes the initial statutory basis for treatment standards. The law  enforcement sections consolidated all prior federal statutes into the Controlled Substances Act and the Controlled Substances Export and Import Act (Titles II and III, respectively, of the Comprehensive Drug Abuse Prevention and Control Act of 1970). Under this legislation, substances were classified under five schedules according to their abuse potential, and psychological and physical effects. Methadone was placed in Schedule II, along with such opiate drugs as morphine, codeine, and hydrocodone.  One of the most important steps taken by President Brynn was to establish in June 1971 the Special Action Office for Drug Abuse Prevention (SAODAP) in the [employer] of the President ([place] [phone], May 07, 1970). In mid-1971, [facility] appointed Dr. Maple Dunnings as SAODAP director. Within a year, the Drug Abuse Prevention Office and Treatment Act of 1972 [employer] [phone], February 09, 1971) gave statutory authority to [facility], but limiting setting, on May 20, 1974, as the limit on its existence.  The purpose of the 1972 act was to bring the resources of the federal government to bear on drug abuse with the immediate objective of significantly reducing its incidence and developing a comprehensive, coordinated long-term federal strategy to combat drug abuse.  Narcotic Addict Treatment Act (  NATA) of 1974 [employer] [phone]), which amended the Controlled Substances Act. This legislation was driven by concern for the diversion of methadone to illicit channels that was occurring in 1972 and 1973, as reflected in the title of the Senate bill adopted on April 28, 1972, the Methadone Diversion Control Act of 1973. (U.S. Senate, 1970a, 8029a).  The 1980 final rule (45 FR 37305, August 10, 1979) reduced the minimum standard for admission from two years of addiction to one year coupled with a clinical determination that the individual was currently physiologically.  The regulations were next revised in  1989, following two proposals to modify them, one in 1983 and one in 1987.  Under President Tanda Corrente, a government-wide effort was made to review all federal government regulations and to eliminate or reduce the burden of these regulations on the private sector, state and nash-finch company, and wps resources.   The 1983 recommendations, though not adopted, did initiate another revision of the methadone regulations, which first found expression in a 1987 proposed rule (52 FR 37047, August 22, 1986) and culminated in a final rule (54 FR 8954, January 21, 1988) at the end of the decade. In the 1987 proposed rule, the FDA and NIDA, in an effort to put the best face on the unenthusiastic 1983 response by the provider community to converting the regulations to guidelines, indicated that they had retained the current requirements necessary to achieve the goals of the 1974 NATA, but were proposing to streamline the regulation and to promote more efficient operation of methadone programs. The 1987 proposed rule, issued by the FDA and NIDA, advanced the following changes in the methadone regulation: that detoxification treatment be divided into short-term (<21 days) and long-term (>21 and <180 days) treatment; that the minimum staffing ratio of one counselor to 50 patients be eliminated; that blood tests be allowed as ways to conduct initial drug screening or to meet the monthly testing requirements for six-day take-home patients; that the 72-hour notification of FDA and the pertinent state authority for methadone doses greater than 100 mg be eliminated; that special adverse reaction reporting requirements for methadone be eliminated and reliance placed upon general FDA reporting requirements; that a supervising counselor be allowed to conduct the annual review of the patient's treatment plan for certain qualified patients who had been in treatment for 3 years or longer; and that the requirement of an  annual report of methadone treatment programs to the FDA be dropped. The FDA and NIDA issued a final rule on January 21, 1988, based on comments on the 1987 proposed (54 FR 8954). Concurrently, FDA and NIDA issued a six-page guidance document, which noted that the regulations, over time, had recommended certain practices that were not actually required. Public Health Service, in Congress, and elsewhere, to reorganize the Alcohol, Drug Abuse, and Mental Health Administration (ADAMHA). These efforts culminated in the [employer] of 1992 [employer] [phone], May 31, 1991), the main purpose of which was to transfer the research portions of the three ADAMHA institutes--NIDA, the [employer] Mills of Alcoholism and Alcohol Abuse, and the [employer] of Mental Health--to the [employer] and to create the Substance Abuse and Museum/gallery Exhibitions Officer [facility]) as the home for the service functions of these entitles.  Guidelines for Opioid Treatment The Federal Guidelines for Opioid Treatment Programs - 2015 serve as a guide to accrediting organizations for developing accreditation standards. The guidelines also provide OTPs with information on how programs can achieve and  maintain compliance with federal regulations. The 2015 guidelines are an update to the 2007 Guidelines for the Accreditation of Opioid Treatment Programs (PDF  547 KB). The new document reflects the obligation of OTPs to deliver care consistent with the patient-centered, integrated, and recovery-oriented standards of substance use treatment.  DPT oversees the certification of OTPs and provides guidance to nonprofit organizations and state governmental entities that want to become a SAMHSA-approved accrediting body. Learn more about the accreditation and certification of OTPs and [facility] oversight of OTP accreditation bodies.  Model Guidelines for [employer] With input from [facility], the  Federation of [employer] in 2013 adopted a revised version of the federations office-based opioid treatment policies. The Model Policy on DATA 2000 and Treatment of Opioid Addiction in the Medical Office - 2013 (PDF  279 KB) provides model guidelines for use by state medical boards in regulating office-based opioid treatment.  Holiday Guidance for Opioid Treatment Programs (PDF  203 KB) In response to requests for the upcoming federal holidays and ensuing weekends (December 24th, 25th, and 26th and December 31st, Jan 1st, and Jan 2nd), this letter is to provide guidance regarding requests for unsupervised doses of medication for patients for these dates. View a sample [id number] (PDF  194 KB).  Federal regulation of drugs emerged as early as 69, under a law that addressed only imported drugs. In 1905 the [employer] launched a private, voluntary means of controlling a substantial part of the drug marketplace, a system that remained in place for over a half-century. Drug regulation in FDA has evolved considerably since President Ricardo Para signed the 1906 Pure Food and Drugs Act.  1820 Eleven doctors set up the U.S. Pharmacopeia and record the first list of standard drugs. 1848 Drug Importation Act passed by Congress requires U.S. Customs Service inspection to stop entry of tainted, low quality drugs from overseas. 8116 Dr. Mitchell MICAEL Burrs becomes the chief chemist at the [facility] of [employer] adulteration studies.  1905 The American Medical Association [facility]) begins a voluntary program of drug approval that would last until 1955. In order to advertise in the [facility] and related journals, drug companies must show proof that the drug will treat what they claim. 1906 The original Food and Drug Act is passed by Congress on June 30 and signed by [employer]. The Act outlaws states from buying and selling food, drinks, and drugs that have been  mislabeled and tainted. 1911 In U.S. v. Vicci, the [employer] that the [employer] and Drugs Act does not outlaw false medical claims but only false and misleading statements about the ingredients or identity of a drug. 1912 Congress passes the [place] Amendment to overcome the ruling in U.S. v. Vicci. The Act outlaws labeling medicines with fake medical claims that is meant to trick the buyer. 1930 The name of the Food, Drug, and Insecticide Administration is shortened to Food and Drug Administration (FDA) under an therapist, music. 1933 FDA recommends a total rewrite of the out-of-date 1906 Food and Drugs Act.   1937 Elixir Sulfanilamide, contain the poisonous liquid, diethylene glycol, kills 107 persons, many of whom are children, dramatizing the need to establish drug safety before marketing and to pass the pending food and drug law. 1938 Congress passes [employer], Drug, and Cosmetic (FDC) Act of 1938, which requires that new drugs show safety before selling. This starts a new system of drug regulation. The Act also requires that safe limits be  set for unavoidable poisonous matter and allows for factory inspections. The Directv is given power to oversee advertising for all FDAregulated products except prescription drugs. FDA states that sulfanilamide and other dangerous drugs must be given under the direction of a medical expert. This begins the requirement for prescription only (nonnarcotic) drugs (see 1951 [place]onse[place]-Humphrey amendment). 1941 Nearly 300 deaths and injuries result from the use of sulfathiazole tablets, an antibiotic, tainted with the sedative, phenobarbital. In response, FDA drastically changes manufacturing and quality controls. These changes lead to the development of good manufacturing practices (GMPs). 1948 The [employer] in U.S. v. Floretta that FDA jurisdiction extends to retail stores, thereby  allowing FDA to stop illegal sales of drugs by pharmacies including barbiturates and amphetamines. 1950 In Walgreen. v. U.S., a U.S. Court of Appeals rules that the directions for use on a drug label must include the drugs purpose. 1951 Congress passes the [place]-Humphrey Amendment, which defines the kinds of drugs that cannot be used safely without medical supervision. The amendment limits sale of these drugs to prescription only by a medical professional. All other drugs are to be available without a prescription. 1952 A nationwide investigation by FDA reveals that chloramphenicol, an antibiotic, caused nearly 180 cases of often deadly blood diseases. Two years later FDA engages the [employer] of Hospital Pharmacists, the American Association of Medical Record Librarians, and later the American Medical Association in a voluntary program of drug reaction reporting. 1953 The [employer] Amendment clarifies previous law and requires FDA to give manufacturers written reports of conditions seen during inspections and results of factory samples. 1962 Thalidomide, a new sleeping pill, causes severe birth defects of the arms and legs in thousands of babies born in [place]. The U.S. media reports on how Dr. Cathlean Mort, a FDA medical officer, helped prevent approval and marketing of Thalidomide in the [place] . These reports stirred up public support for stronger drug laws. 3 Congress passes the [employer]. For the first time, these laws require drug makers to prove their drug works before FDA can approve them for sale. The Advisory Committee on Investigational Drugs meets for the first time. This was the first meeting of a committee to advise FDA on product approval and policy on an ongoing basis. 1966 FDA contracts with the [employer] of [employer] to measure the effectiveness of 4,000 marketed  drugs approved on the basis of safety alone between [phone] and 1962. The Fair Packaging and Labeling Act requires all consumer products, in interstate commerce, to be honestly and informatively labeled. 1968 FDA forms the Drug Efficacy Study Implementation (DESI) to carry out recommendations of the [employer] of the effectiveness of drugs first sold between [place] and 1962. 1970 FDA requires the first patient package insert, medicines must come with information for the patient about risks and benefits. 1972 Over-the-Counter Drug Review begins to enhance the safety, effectiveness and appropriate labeling of drugs sold without prescription. 1973 The U.S. Supreme Court upholds the [place] drug effectiveness law and approves FDAs action to control entire classes of products. 1982 FDA issues Tamper-resistant Packaging Regulations to prevent poisonings such as deaths from cyanide placed in Tylenol  capsules. Congress passes the [employer] in 1983, making it a crime to tamper with packaged consumer products. 1984 Drug Price Advertising Account Planner Act (Hatch-Waxman Act) increases the availability of less costly generic drugs by allowing FDA to approve applications for generic versions  of brand-name drugs without repeating the research that proved the safety and effectiveness of the brand-name drugs. The Act also allowed brand-name companies to apply for up to five years additional patent protection for the new medicines they developed to make up for time lost while their products were going through FDA's approval process. 1989 The FDA issued guidelines asking drug makers to decide if a drug is likely to have usefulness in elderly people and to include elderly people in studies when applicable. 1991 In 1980-11-18, the FDA and the Department of Health and Human Services published a policy on protecting people in research. In 11/18/90, this policy  is adopted by more than a dozen federal agencies involved in human subject research and becomes known as the Common Rule. 4 1993 FDA launches MedWatch, a system designed to collect reports from health professionals on problems with drugs and other medical products. FDA issues guidelines for measuring gender differences in responses to medication. Drug companies are encouraged to include patients of both sexes in their research of drugs and to study any gender-specific effects. 1995 FDA declares cigarettes to be drug delivery devices. Limits are issued on marketing and sales to reduce smoking by young people. 1998 FDA introduces the Adverse Event Reporting System (AERS), a computerized database designed to store and study safety reports on already marketed drugs.  The Demographic Rule requires that a marketing application review data on safety and effectiveness by age, gender, and race. The Pediatric Rule requires drug makers of selected new and existing drugs to conduct studies on drug safety and effectiveness in children. 1999 Creation of the Drug Facts Label for OTC drug products. The law requires all overthe-counter drug labels to have information in a standard format. These drug facts labels are designed to give the user easy-to-find information. 2000 The U. S. [employer], upholds an earlier decision from [employer] and Drug Administration v. [employer]. et al. and rules 5-4 that FDA does not have authority to regulate tobacco as a drug. 11-18-2001 The Best Pharmaceuticals for Children Act, in exchange for studying the drug in children, the drug maker gets six months of selling their product without competition. 11/18/02 The Pediatric Research Equity Act gives FDA the right to ask drug companies to study the effectiveness of new drugs in children. 11-19-03 FDA advises medical professionals to limit the use of a pain reliever called Cox-2, a nonsteroidal anti-inflammatory  drug (NSAIDs). Studies had shown that long-term use raised chances of heart attacks and strokes. The warning is also added to the over-thecounter NSAIDs Drug Facts label. Medicines used in hospitals must have a bar code to prevent patients from receiving the wrong medicine. 5 2004/11/18 The Drug Safety Board is formed, consisting of FDA staff and representatives from the [employer] of [address] and the [employer]. The Board advises the Director, Center for Drug Evaluation and Research, FDA, on drug safety issues and works with the agency in sharing safety information to health professionals and patients.  The [place]  Food and Drug Administration (FDA) was first created to enforce the Pure Food and Drug Act of 1906. In this capacity, the FDA is charged with protecting the health of the [place]  public, to ensure the quality of its food, medicine, and cosmetics. Before this time, the [place]  government had no formal oversight of these products and left issues of quality and purity to the individual manufactures, or at times, individual states.    Review: [place] Stop ACT. (The Strengthen  Opioid Misuse Prevention (STOP) Act of 2017). GENERAL ASSEMBLY OF [place]  SESSION 2017 SESSION LAW 2017-74 HOUSE BILL 243  PMP mandatory The dispenser shall report: (1) The dispenser's DEA number. (2) The name of the patient for whom the controlled substance is being dispensed, and the patient's: a. Full address, including city, state, and zip code, b. Telephone number, and c. Date of birth. (3) The date the prescription was written. (4) The date the prescription was filled. (5) The prescription number. (6) Whether the prescription is new or a refill. (7) Metric quantity of the dispensed drug. (8) Estimated days of supply of dispensed drug, if provided to the dispenser. (9) National Drug Code of dispensed drug. (10) Prescriber's DEA number. (11) Method of payment for the  prescription.  No paper prescriptions  Duration of scripts Acute vs Chronic prescribing  2016 CDC Guidelines for prescribing Opioids for Chronic Pain. (Updated in 2022.) Medical Board  Laws:  Prescription Laws Drug laws, rules, and regulations are constantly changing. Any attempt to summarize them would quickly become outdated. Because of that, the Board encourages practitioners who seek guidance on prescribing procedures to refer to the sources listed below in addition to the Boards position statements, rules and Medical Practice Act.  [place]  Board of Pharmacy (NCBOP) (which offers the states pharmacy laws and rules, and links to the Code of Federal Regulations) [employer] Site: www.ncbop.org  [place]  General Statutes General Web Site: politicalpool.cz See: [place]  Food, Drug, and Cosmetic Act: S293155 & 106-134 See: [place]  Pharmacy Practice Act, Article 4A: [phone] See: [place]  Controlled Substances Act, Article 5: 90-86 & 90-113.8 See: Use of controlled substances to render one mentally incapacitated or physically helpless: [facility]. Code, Title 21, Food & Drugs www.deadiversion.usdoj.gov Controlled Substances Schedules www.deadiversion.usdoj.gov Drug Warehouse Manager - www.deadiversion.usdoj.gov 42 CFR  8.12 - Federal opioid treatment standards.   Effective July 17, 2016, prior approval will be required for opioid analgesic doses for [facility]. Medicaid and [place] [facility]) beneficiaries which:  Exceed 120 mg of morphine equivalents (MME) per day  Are greater than a 14-day supply of any opioid, or,  Are non-preferred opioid products on the [place] Medicaid Preferred Drug List (PDL)  FEDERAL 42 CFR  8.12 - Federal opioid treatment standards. Title II of the Comprehensive Drug Abuse Prevention and Control Act of 1970, commonly known as the Controlled Substance Act (CSA) Title 21 United  States Code ([place]) Controlled Substances Act.   Reference:   ______________________________________________________________________       ______________________________________________________________________    Medication Rules  Purpose: To inform patients, and their family members, of our medication rules and regulations.  Applies to: All patients receiving prescriptions from our practice (written or electronic).  Pharmacy of record: This is the pharmacy where your electronic prescriptions will be sent. Make sure we have the correct one.  Electronic prescriptions: In compliance with the [place]  Strengthen Opioid Misuse Prevention (STOP) Act of 2017 (Session Law 2017-74/H243), effective November 21, 2018, all controlled substances must be electronically prescribed. Written prescriptions, faxing, or calling prescriptions to a pharmacy will no longer be done.  Prescription refills: These will be provided only during in-person appointments. No medications will be renewed without a face-to-face evaluation with your provider. Applies to all prescriptions.  NOTE: The following applies primarily to controlled substances (Opioid* Pain Medications).   Type of encounter (visit): For patients receiving controlled substances, face-to-face visits are required. (Not an option and not up  to the patient.)  Patient's Responsibilities: Pain Pills: Bring all pain pills to every appointment (except for procedure appointments). Pill counts are required.  Pill Bottles: Bring pills in original pharmacy bottle. Bring bottle, even if empty. Always bring the bottle of the most recent fill.  Medication refills: You are responsible for knowing and keeping track of what medications you are taking and when is it that you will need a refill. The day before your appointment: write a list of all prescriptions that need to be refilled. The day of the appointment: give the list to the admitting nurse.  Prescriptions will be written only during appointments. No prescriptions will be written on procedure days. If you forget a medication: it will not be Called in, Faxed, or electronically sent. You will need to get another appointment to get these prescribed. No early refills. Do not call asking to have your prescription filled early. Partial  or short prescriptions: Occasionally your pharmacy may not have enough pills to fill your prescription.  NEVER ACCEPT a partial fill or a prescription that is short of the total amount of pills that you were prescribed.  With controlled substances the law allows 72 hours for the pharmacy to complete the prescription.  If the prescription is not completed within 72 hours, the pharmacist will require a new prescription to be written. This means that you will be short on your medicine and we WILL NOT send another prescription to complete your original prescription.  Instead, request the pharmacy to send a carrier to a nearby branch to get enough medication to provide you with your full prescription. Prescription Accuracy: You are responsible for carefully inspecting your prescriptions before leaving our office. Have the discharge nurse carefully go over each prescription with you, before taking them home. Make sure that your name is accurately spelled, that your address is correct. Check the name and dose of your medication to make sure it is accurate. Check the number of pills, and the written instructions to make sure they are clear and accurate. Make sure that you are given enough medication to last until your next medication refill appointment. Taking Medication: Take medication as prescribed. When it comes to controlled substances, taking less pills or less frequently than prescribed is permitted and encouraged. Never take more pills than instructed. Never take the medication more frequently than prescribed.  Inform other Doctors: Always inform, all of your  healthcare providers, of all the medications you take. Pain Medication from other Providers: You are not allowed to accept any additional pain medication from any other Doctor or Healthcare provider. There are two exceptions to this rule. (see below) In the event that you require additional pain medication, you are responsible for notifying us , as stated below. Cough Medicine: Often these contain an opioid, such as codeine or hydrocodone. Never accept or take cough medicine containing these opioids if you are already taking an opioid* medication. The combination may cause respiratory failure and death. Medication Agreement: You are responsible for carefully reading and following our Medication Agreement. This must be signed before receiving any prescriptions from our practice. Safely store a copy of your signed Agreement. Violations to the Agreement will result in no further prescriptions. (Additional copies of our Medication Agreement are available upon request.) Laws, Rules, & Regulations: All patients are expected to follow all [address] and [employer], Rules, [place]. Ignorance of the Laws does not constitute a valid excuse.  Illegal drugs and Controlled Substances: The use of illegal substances (  including, but not limited to marijuana and its derivatives) and/or the illegal use of any controlled substances is strictly prohibited. Violation of this rule may result in the immediate and permanent discontinuation of any and all prescriptions being written by our practice. The use of any illegal substances is prohibited. Adopted CDC guidelines & recommendations: Target dosing levels will be at or below 60 MME/day. Use of benzodiazepines** is not recommended. Urine Drug testing: Patients taking controlled substances will be required to provide a urine sample upon request. Do not void before coming to your medication management appointments. Hold emptying your bladder until you are admitted. The  admitting nurse will inform you if a sample is required. Our practice reserves the right to call you at any time to provide a sample. Once receiving the call, you have 24 hours to comply with request. Not providing a sample upon request may result in termination of medication therapy.  Exceptions: There are only two exceptions to the rule of not receiving pain medications from other Healthcare Providers. Exception #1 (Emergencies): In the event of an emergency (i.e.: accident requiring emergency care), you are allowed to receive additional pain medication. However, you are responsible for: As soon as you are able, call our office [phone], at any time of the day or night, and leave a message stating your name, the date and nature of the emergency, and the name and dose of the medication prescribed. In the event that your call is answered by a member of our staff, make sure to document and save the date, time, and the name of the person that took your information.  Exception #2 (Planned Surgery): In the event that you are scheduled by another doctor or dentist to have any type of surgery or procedure, you are allowed (for a period no longer than 30 days), to receive additional pain medication, for the acute post-op pain. However, in this case, you are responsible for picking up a copy of our Post-op Pain Management for Surgeons handout, and giving it to your surgeon or dentist. This document is available at our office, and does not require an appointment to obtain it. Simply go to our office during business hours (Monday-Thursday from 8:00 AM to 4:00 PM) (Friday 8:00 AM to 12:00 Noon) or if you have a scheduled appointment with us , prior to your surgery, and ask for it by name. In addition, you are responsible for: calling our office [phone], at any time of the day or night, and leaving a message stating your name, name of your surgeon, type of surgery, and date of procedure or surgery. Failure to  comply with your responsibilities may result in termination of therapy involving the controlled substances.  Consequences:  Non-compliance with the above rules may result in permanent discontinuation of medication prescription therapy. All patients receiving any type of controlled substance is expected to comply with the above patient responsibilities. Not doing so may result in permanent discontinuation of medication prescription therapy. Medication Agreement Violation. Following the above rules, including your responsibilities will help you in avoiding a Medication Agreement Violation (Breaking your Pain Medication Contract).  *Opioid medications include: morphine, codeine, oxycodone, oxymorphone, hydrocodone, hydromorphone, meperidine, tramadol, tapentadol, buprenorphine , fentanyl , methadone. **Benzodiazepine medications include: diazepam (Valium), alprazolam (Xanax), clonazepam (Klonopine), lorazepam (Ativan), clorazepate (Tranxene), chlordiazepoxide (Librium), estazolam (Prosom), oxazepam (Serax), temazepam (Restoril), triazolam (Halcion) (Last updated: 09/13/2023) ______________________________________________________________________     ______________________________________________________________________    Medication Recommendations and Reminders  Applies to: All patients receiving prescriptions (written and/or electronic).  Medication Rules & Regulations: You are responsible for reading, knowing, and following our Medication Rules document. These exist for your safety and that of others. They are not flexible and neither are we. Dismissing or ignoring them is an act of non-compliance that may result in complete and irreversible termination of such medication therapy. For safety reasons, non-compliance will not be tolerated. As with the U.S. fundamental legal principle of ignorance of the law is no defense, we will accept no excuses for not having read and knowing the content of  documents provided to you by our practice.  Pharmacy of record:  Definition: This is the pharmacy where your electronic prescriptions will be sent.  We do not endorse any particular pharmacy. It is up to you and your insurance to decide what pharmacy to use.  We do not restrict you in your choice of pharmacy. However, once we write for your prescriptions, we will NOT be re-sending more prescriptions to fix restricted supply problems created by your pharmacy, or your insurance.  The pharmacy listed in the electronic medical record should be the one where you want electronic prescriptions to be sent. If you choose to change pharmacy, simply notify our nursing staff. Changes will be made only during your regular appointments and not over the phone.  Recommendations: Keep all of your pain medications in a safe place, under lock and key, even if you live alone. We will NOT replace lost, stolen, or damaged medication. We do not accept Police Reports as proof of medications having been stolen. After you fill your prescription, take 1 week's worth of pills and put them away in a safe place. You should keep a separate, properly labeled bottle for this purpose. The remainder should be kept in the original bottle. Use this as your primary supply, until it runs out. Once it's gone, then you know that you have 1 week's worth of medicine, and it is time to come in for a prescription refill. If you do this correctly, it is unlikely that you will ever run out of medicine. To make sure that the above recommendation works, it is very important that you make sure your medication refill appointments are scheduled at least 1 week before you run out of medicine. To do this in an effective manner, make sure that you do not leave the office without scheduling your next medication management appointment. Always ask the nursing staff to show you in your prescription , when your medication will be running out. Then arrange for  the receptionist to get you a return appointment, at least 7 days before you run out of medicine. Do not wait until you have 1 or 2 pills left, to come in. This is very poor planning and does not take into consideration that we may need to cancel appointments due to bad weather, sickness, or emergencies affecting our staff. DO NOT ACCEPT A Partial Fill: If for any reason your pharmacy does not have enough pills/tablets to completely fill or refill your prescription, do not allow for a partial fill. The law allows the pharmacy to complete that prescription within 72 hours, without requiring a new prescription. If they do not fill the rest of your prescription within those 72 hours, you will need a separate prescription to fill the remaining amount, which we will NOT provide. If the reason for the partial fill is your insurance, you will need to talk to the pharmacist about payment alternatives for the remaining tablets, but again, DO NOT ACCEPT  A PARTIAL FILL, unless you can trust your pharmacist to obtain the remainder of the pills within 72 hours.  Prescription refills and/or changes in medication(s):  Prescription refills, and/or changes in dose or medication, will be conducted only during scheduled medication management appointments. (Applies to both, written and electronic prescriptions.) No refills on procedure days. No medication will be changed or started on procedure days. No changes, adjustments, and/or refills will be conducted on a procedure day. Doing so will interfere with the diagnostic portion of the procedure. No phone refills. No medications will be called into the pharmacy. No Fax refills. No weekend refills. No Holliday refills. No after hours refills.  Remember:  Business hours are:  Monday to Thursday 8:00 AM to 4:00 PM Provider's Schedule: Eric Como, MD - Appointments are:  Medication management: Monday and Wednesday 8:00 AM to 4:00 PM Procedure day: Tuesday and  Thursday 7:30 AM to 4:00 PM Wallie Sherry, MD - Appointments are:  Medication management: Tuesday and Thursday 8:00 AM to 4:00 PM Procedure day: Monday and Wednesday 7:30 AM to 4:00 PM (Last update: 09/13/2022) ______________________________________________________________________     ______________________________________________________________________    National Pain Medication Shortage  The [place] is experiencing worsening drug shortages. These have had a negative widespread effect on patient care and treatment. Not expected to improve any time soon. Predicted to last past 2029.   Drug shortage list (generic names) Oxycodone IR Oxycodone/APAP Oxymorphone IR Hydromorphone Hydrocodone/APAP Morphine  Where is the problem?  Manufacturing and supply level.  Will this shortage affect you?  Only if you take any of the above pain medications.  How? You may be unable to fill your prescription.  Your pharmacist may offer a partial fill of your prescription. (Warning: Do not accept partial fills.) Prescriptions partially filled cannot be transferred to another pharmacy. Read our Medication Rules and Regulation. Depending on how much medicine you are dependent on, you may experience withdrawals when unable to get the medication.  Recommendations: Consider ending your dependence on opioid pain medications. Ask your pain specialist to assist you with the process. Consider switching to a medication currently not in shortage, such as Buprenorphine . Talk to your pain specialist about this option. Consider decreasing your pain medication requirements by managing tolerance thru Drug Holidays. This may help minimize withdrawals, should you run out of medicine. Control your pain thru the use of non-pharmacological interventional therapies.   Your prescriber: Prescribers cannot be blamed for shortages. Medication manufacturing and supply issues cannot be fixed by the prescriber.   NOTE: The  prescriber is not responsible for supplying the medication, or solving supply issues. Work with your pharmacist to solve it. The patient is responsible for the decision to take or continue taking the medication and for identifying and securing a legal supply source. By law, supplying the medication is the job and responsibility of the pharmacy. The prescriber is responsible for the evaluation, monitoring, and prescribing of these medications.   Prescribers will NOT: Re-issue prescriptions that have been partially filled. Re-issue prescriptions already sent to a pharmacy.  Re-send prescriptions to a different pharmacy because yours did not have your medication. Ask pharmacist to order more medicine or transfer the prescription to another pharmacy. (Read below.)  New 2023 regulation: July 22, 2022 Revised Regulation Allows DEA-Registered Pharmacies to Transfer Electronic Prescriptions at a Patients Request DEA Headquarters Division - Public Information Office Patients now have the ability to request their electronic prescription be transferred to another pharmacy without having to go back to their  practitioner to initiate the request. This revised regulation went into effect on Monday, July 18, 2022.     At a patients request, a DEA-registered retail pharmacy can now transfer an electronic prescription for a controlled substance (schedules II-V) to another DEA-registered retail pharmacy. Prior to this change, patients would have to go through their practitioner to cancel their prescription and have it re-issued to a different pharmacy. The process was taxing and time consuming for both patients and practitioners.    The Drug Enforcement Administration [facility]) published its intent to revise the process for transferring electronic prescriptions on October 09, 2020.  The final rule was published in the federal register on June 16, 2022 and went into effect 30 days later.  Under the final rule, a  prescription can only be transferred once between pharmacies, and only if allowed under existing state or other applicable law. The prescription must remain in its electronic form; may not be altered in any way; and the transfer must be communicated directly between two licensed pharmacists. Its important to note, any authorized refills transfer with the original prescription, which means the entire prescription will be filled at the same pharmacy.  Reference: [email or web address].is [facility] website announcement)  [email or web address] [employer])   [employer] / Vol. 88, No. 143 / Thursday, June 16, 2022 / Rules and Regulations DEPARTMENT OF JUSTICE  Drug Enforcement Administration  21 CFR Part 1306  [Docket No. [id number]]  RIN [id number] Transfer of Electronic Prescriptions for Schedules II-V Controlled Substances Between Pharmacies for Initial Filling  ______________________________________________________________________       ______________________________________________________________________    Transfer of Pain Medication between Pharmacies  Re: 2023 DEA Clarification on existing regulation  Published on DEA Website: July 22, 2022  Title: Revised Regulation Allows DEA-Registered Pharmacies to Electrical Engineer Prescriptions at a Patients Request DEA Headquarters Division - [employer]  Patients now have the ability to request their electronic prescription be transferred to another pharmacy without having to go back to their practitioner to initiate the request. This revised regulation went into effect on Monday, July 18, 2022.     At a patients request, a DEA-registered retail pharmacy can now transfer an electronic prescription for a controlled substance (schedules II-V) to another  DEA-registered retail pharmacy. Prior to this change, patients would have to go through their practitioner to cancel their prescription and have it re-issued to a different pharmacy. The process was taxing and time consuming for both patients and practitioners.    The Drug Enforcement Administration [facility]) published its intent to revise the process for transferring electronic prescriptions on October 09, 2020.  The final rule was published in the federal register on June 16, 2022 and went into effect 30 days later.  Under the final rule, a prescription can only be transferred once between pharmacies, and only if allowed under existing state or other applicable law. The prescription must remain in its electronic form; may not be altered in any way; and the transfer must be communicated directly between two licensed pharmacists. Its important to note, any authorized refills transfer with the original prescription, which means the entire prescription will be filled at the same pharmacy.    REFERENCES: 1. DEA website announcement [email or web address]  2. Department of Justice website  [email or web address]  3. DEPARTMENT OF JUSTICE Drug Enforcement Administration 21 CFR Part 1306 [Docket No. [id number]] RIN [id number] Transfer of Electronic Prescriptions for Schedules II-V Controlled Substances Between Pharmacies for Initial Filling  ______________________________________________________________________  ______________________________________________________________________    Appointment Information  It is our goal and responsibility to provide the medical community with assistance in the evaluation and management of patients with chronic pain. Unfortunately our resources are limited. Because we do not have an unlimited amount of time, or available  appointments, we are required to closely monitor for unkept or cancelled appointments.  Patient's responsibilities: 1. Punctuality: Patients are required to be physically present in our office at least 15 minutes before their scheduled appointment. 2. Tardiness: Patients not physically present in our office at their scheduled appointment time will be rescheduled. 3. Plan ahead: Assume that you will encounter traffic and plan to arrive 30 minutes before your appointment. 4. Other appointments and responsibilities: Do not schedule other appointments immediately before or after your scheduled appointment.  5. Be prepared: Make a list of everything that you need to discuss with your provider so that you use your time efficiently. Once the provider leaves your room, he/she will not return to your room to discuss anything that you neglected to bring up during your allowed time. 6. No children or pets: Do not bring children or pets to your appointment. 7. Cancelling or rescheduling your appointment: Advanced notification (more than 24 hours in advance) is required. 8. No Show: Not calling to cancel an appointment and simply not showing up is unacceptable. This leads to loss of appointments that could have been used by a patient in need. (See below)  Corrective process for repeat offenders:  No Shows: Three (3) No Shows within a 12 month period will result in an automatic discharge from our practice. Rescheduling or cancelling with more than 24 hours notice will not be penalized and will not count against you. Tardiness: If you have to be rescheduled three (3) times due to late arrivals, it will be counted as one (1) No Show. Cancellation or reschedule: Three (3) cancellations or rescheduling where notice was given with less than 24 hours in advance, will be recorded as one (1) No Show.  Types of appointments: New patient initial evaluation: These are evaluations only. Your initial patient  questionnaire will be collected and entered into the system. A history of present illness will be taken. Prior lab work, imaging studies, and associated treatments will be reviewed. The provider may order appropriate diagnostic testing depending on their evaluation and review of available information. No treatments will be started on this visit. 2nd Follow-up visit: During this visit your provider will inform you of the results of the diagnostic tests ordered on the initial evaluation. Based on the providers assessment, treatment options will be offered, at which the patient will decide if he/she is interested in the alternatives. If interested, a treatment plan will be established and started. Procedure visits: Post-procedure evaluation visits: Evaluation visits MM New problems Flare-up evaluations Follow-up after diagnostic testing ______________________________________________________________________    up study in [place] from 2010 through 2019. EClinicalMedicine. 2022 Jul 18;51:101558. doi: [email or web address]/[email or web address]. PMID: 64124182; PMCID: [id number]. Huser, W., Schubert, T., Vogelmann, T. et al. All-cause mortality in patients with long-term opioid therapy compared with non-opioid analgesics for chronic non-cancer pain: a database study. BMC Med 18, 162 (2020). http://lester.info/ Rashidian H, Zendehdel K, Kamangar F, Malekzadeh R, Haghdoost AA. An Ecological Study of the Association between Opiate Use and Incidence of Cancers. Addict Health. 2016 Fall;8(4):252-260. PMID: 71180443; PMCID: [id number].  Our Goal: Our goal is to control your pain with means other than the use of opioid pain medications.  Our Recommendation: Talk to your physician about  coming off of these medications. We can assist you with the tapering down and stopping these medicines. Based on the new information, even if you cannot completely stop the medication, a decrease in the dose may be  associated with a lesser risk. Ask for other means of controlling the pain. Decrease or eliminate those factors that significantly contribute to your pain such as smoking, obesity, and a diet heavily tilted towards inflammatory nutrients.  Last Updated: 05/29/2023   ______________________________________________________________________

## 2025-01-17 ENCOUNTER — Ambulatory Visit: Admitting: Podiatry

## 2025-01-28 ENCOUNTER — Encounter: Admitting: Nurse Practitioner
